# Patient Record
Sex: Female | Born: 1980 | Race: Black or African American | Hispanic: No | Marital: Single | Smoking: Current every day smoker
Health system: Southern US, Community
[De-identification: ages and names within clinical notes are randomized; demographics above are authoritative.]

## PROBLEM LIST (undated history)

## (undated) DIAGNOSIS — Z8619 Personal history of other infectious and parasitic diseases: Secondary | ICD-10-CM

## (undated) DIAGNOSIS — Z98891 History of uterine scar from previous surgery: Secondary | ICD-10-CM

## (undated) DIAGNOSIS — H02402 Unspecified ptosis of left eyelid: Principal | ICD-10-CM

## (undated) DIAGNOSIS — B9689 Other specified bacterial agents as the cause of diseases classified elsewhere: Secondary | ICD-10-CM

## (undated) DIAGNOSIS — N83209 Unspecified ovarian cyst, unspecified side: Secondary | ICD-10-CM

## (undated) DIAGNOSIS — Z975 Presence of (intrauterine) contraceptive device: Secondary | ICD-10-CM

## (undated) DIAGNOSIS — N760 Acute vaginitis: Secondary | ICD-10-CM

## (undated) DIAGNOSIS — K219 Gastro-esophageal reflux disease without esophagitis: Secondary | ICD-10-CM

## (undated) DIAGNOSIS — T7492XA Unspecified child maltreatment, confirmed, initial encounter: Secondary | ICD-10-CM

## (undated) HISTORY — DX: Acute vaginitis: N76.0

## (undated) HISTORY — DX: Unspecified ovarian cyst, unspecified side: N83.209

## (undated) HISTORY — DX: Personal history of other infectious and parasitic diseases: Z86.19

## (undated) HISTORY — DX: Other specified bacterial agents as the cause of diseases classified elsewhere: B96.89

## (undated) HISTORY — DX: Unspecified child maltreatment, confirmed, initial encounter: T74.92XA

## (undated) HISTORY — DX: Presence of (intrauterine) contraceptive device: Z97.5

## (undated) HISTORY — DX: Unspecified ptosis of left eyelid: H02.402

---

## 2000-11-03 ENCOUNTER — Emergency Department (HOSPITAL_COMMUNITY): Admission: EM | Admit: 2000-11-03 | Discharge: 2000-11-03 | Payer: Self-pay | Admitting: Emergency Medicine

## 2002-12-02 ENCOUNTER — Other Ambulatory Visit: Admission: RE | Admit: 2002-12-02 | Discharge: 2002-12-02 | Payer: Self-pay | Admitting: Gynecology

## 2003-10-09 ENCOUNTER — Emergency Department (HOSPITAL_COMMUNITY): Admission: EM | Admit: 2003-10-09 | Discharge: 2003-10-10 | Payer: Self-pay | Admitting: Emergency Medicine

## 2004-06-13 ENCOUNTER — Ambulatory Visit: Payer: Self-pay | Admitting: Internal Medicine

## 2006-01-17 ENCOUNTER — Emergency Department (HOSPITAL_COMMUNITY): Admission: EM | Admit: 2006-01-17 | Discharge: 2006-01-18 | Payer: Self-pay | Admitting: Emergency Medicine

## 2006-06-06 ENCOUNTER — Ambulatory Visit: Payer: Self-pay | Admitting: Family Medicine

## 2006-06-13 ENCOUNTER — Emergency Department (HOSPITAL_COMMUNITY): Admission: EM | Admit: 2006-06-13 | Discharge: 2006-06-13 | Payer: Self-pay | Admitting: Emergency Medicine

## 2006-06-13 ENCOUNTER — Ambulatory Visit (HOSPITAL_COMMUNITY): Admission: RE | Admit: 2006-06-13 | Discharge: 2006-06-13 | Payer: Self-pay | Admitting: Family Medicine

## 2006-06-27 ENCOUNTER — Encounter (INDEPENDENT_AMBULATORY_CARE_PROVIDER_SITE_OTHER): Payer: Self-pay | Admitting: *Deleted

## 2006-06-27 ENCOUNTER — Ambulatory Visit: Payer: Self-pay | Admitting: Family Medicine

## 2006-08-03 ENCOUNTER — Emergency Department (HOSPITAL_COMMUNITY): Admission: EM | Admit: 2006-08-03 | Discharge: 2006-08-03 | Payer: Self-pay | Admitting: Emergency Medicine

## 2006-10-29 ENCOUNTER — Ambulatory Visit: Payer: Self-pay | Admitting: Family Medicine

## 2006-10-29 ENCOUNTER — Ambulatory Visit: Payer: Self-pay | Admitting: *Deleted

## 2006-11-20 ENCOUNTER — Ambulatory Visit: Payer: Self-pay | Admitting: Family Medicine

## 2006-11-21 ENCOUNTER — Encounter (INDEPENDENT_AMBULATORY_CARE_PROVIDER_SITE_OTHER): Payer: Self-pay | Admitting: *Deleted

## 2006-12-18 ENCOUNTER — Emergency Department (HOSPITAL_COMMUNITY): Admission: EM | Admit: 2006-12-18 | Discharge: 2006-12-18 | Payer: Self-pay | Admitting: Emergency Medicine

## 2007-01-03 ENCOUNTER — Ambulatory Visit: Payer: Self-pay | Admitting: Family Medicine

## 2007-01-03 ENCOUNTER — Encounter (INDEPENDENT_AMBULATORY_CARE_PROVIDER_SITE_OTHER): Payer: Self-pay | Admitting: Family Medicine

## 2007-03-07 HISTORY — PX: WISDOM TOOTH EXTRACTION: SHX21

## 2007-08-12 ENCOUNTER — Encounter (INDEPENDENT_AMBULATORY_CARE_PROVIDER_SITE_OTHER): Payer: Self-pay | Admitting: Family Medicine

## 2007-08-12 ENCOUNTER — Ambulatory Visit: Payer: Self-pay | Admitting: Internal Medicine

## 2007-08-12 LAB — CONVERTED CEMR LAB
ALT: 12 units/L (ref 0–35)
AST: 12 units/L (ref 0–37)
Basophils Absolute: 0 10*3/uL (ref 0.0–0.1)
Chloride: 104 meq/L (ref 96–112)
Creatinine, Ser: 0.78 mg/dL (ref 0.40–1.20)
Eosinophils Absolute: 0.1 10*3/uL (ref 0.0–0.7)
Eosinophils Relative: 1 % (ref 0–5)
HCT: 40.3 % (ref 36.0–46.0)
Lymphocytes Relative: 35 % (ref 12–46)
Neutrophils Relative %: 57 % (ref 43–77)
Platelets: 220 10*3/uL (ref 150–400)
RDW: 12.7 % (ref 11.5–15.5)
Sed Rate: 4 mm/hr (ref 0–22)
Total Bilirubin: 0.5 mg/dL (ref 0.3–1.2)
Vit D, 1,25-Dihydroxy: 26 — ABNORMAL LOW (ref 30–89)

## 2007-09-30 ENCOUNTER — Ambulatory Visit: Payer: Self-pay | Admitting: Family Medicine

## 2007-09-30 ENCOUNTER — Inpatient Hospital Stay (HOSPITAL_COMMUNITY): Admission: AD | Admit: 2007-09-30 | Discharge: 2007-09-30 | Payer: Self-pay | Admitting: Family Medicine

## 2008-03-05 ENCOUNTER — Inpatient Hospital Stay (HOSPITAL_COMMUNITY): Admission: AD | Admit: 2008-03-05 | Discharge: 2008-03-05 | Payer: Self-pay | Admitting: Obstetrics and Gynecology

## 2008-05-02 ENCOUNTER — Inpatient Hospital Stay (HOSPITAL_COMMUNITY): Admission: AD | Admit: 2008-05-02 | Discharge: 2008-05-05 | Payer: Self-pay | Admitting: Obstetrics and Gynecology

## 2008-07-31 ENCOUNTER — Emergency Department (HOSPITAL_COMMUNITY): Admission: EM | Admit: 2008-07-31 | Discharge: 2008-07-31 | Payer: Self-pay | Admitting: Emergency Medicine

## 2010-02-28 ENCOUNTER — Inpatient Hospital Stay (HOSPITAL_COMMUNITY)
Admission: AD | Admit: 2010-02-28 | Discharge: 2010-02-28 | Payer: Self-pay | Source: Home / Self Care | Attending: Obstetrics & Gynecology | Admitting: Obstetrics & Gynecology

## 2010-05-16 LAB — POCT PREGNANCY, URINE: Preg Test, Ur: POSITIVE

## 2010-06-21 LAB — CBC
HCT: 42 % (ref 36.0–46.0)
Hemoglobin: 11.4 g/dL — ABNORMAL LOW (ref 12.0–15.0)
Hemoglobin: 13.9 g/dL (ref 12.0–15.0)
MCHC: 33.4 g/dL (ref 30.0–36.0)
MCV: 94.5 fL (ref 78.0–100.0)
RBC: 3.61 MIL/uL — ABNORMAL LOW (ref 3.87–5.11)
WBC: 15.4 10*3/uL — ABNORMAL HIGH (ref 4.0–10.5)

## 2010-07-19 NOTE — Op Note (Signed)
Holly Black, Holly Black NO.:  [id number]   MEDICAL RECORD NO.:  [id number]          PATIENT TYPE:  INP   LOCATION:  9143                          FACILITY:  [facility]   PHYSICIAN:  Janine Limbo, M.D.DATE OF BIRTH:  04/09/1980   DATE OF PROCEDURE:  05/02/2008  DATE OF DISCHARGE:                               OPERATIVE REPORT   PREOPERATIVE DIAGNOSES:  1. Term intrauterine gestation.  2. Failure to progress in labor.   POSTOPERATIVE DIAGNOSES:  1. Term intrauterine gestation.  2. Failure to progress in labor.  3. Right occiput transverse presentation.Marland Kitchen   PROCEDURE:  Primary low transverse cesarean section.   SURGEON:  Janine Limbo, MD   FIRST ASSISTANT:  Renaldo Reel. Emilee Hero, CNM.   ANESTHETIC:  Epidural.   DISPOSITION:  Holly Black is a 30 year old female, gravida 1, para 0, who  presented at 38 weeks and 6 days gestation.  She has been followed at  the [facility].  She ruptured her membranes this morning.  She  had very slow progression of her labor.  Pitocin augmentation was begun  and the patient did not progress in spite of greater than 240 [place]  units for several hours.  A cesarean section was recommended.  We  reviewed the risk associated with C-section including, but not limited  to, anesthetic complications, bleeding, infections, and possible damage  to the surrounding organs.  All of the patient's questions were  answered.  She was ready to proceed as described above.   FINDINGS:  A 6-pound 10-ounce female infant Holly Black) was delivered from a  right occiput transverse presentation.  The Apgars were 9 at 1 minute  and 9 at 5 minutes.  The uterus, fallopian tubes, and the ovaries were  normal for the gravid state.   PROCEDURE IN DETAIL:  The patient was taken to the operating room where  it was determined that the epidural she had received for labor would be  adequate  for cesarean delivery.  A Foley catheter had previously been  placed.  The patient's abdomen was prepped with multiple layers of  Betadine and then the patient was sterilely draped.  The patient had  received 2 g of Ancef 30 minutes prior to her procedure.  The lower  abdomen was injected with 10 mL of 0.5% Marcaine with epinephrine.  A  low transverse incision was made and carried sharply through the  subcutaneous tissue, the fascia, and the anterior peritoneum.  An  incision was made in the lower uterine segment and extended in a low-  transverse fashion.  The fetal head was delivered without difficulty.  The mouth and nose were suctioned.  The remainder of the infant was then  delivered.  The cord was clamped and cut, and the infant was handed to  the awaiting pediatric team.  The placenta was removed and given to the  Cord Blood Registry Team.  The placenta was sent to Labor and Delivery  after their completion.  The uterine cavity was cleaned of amniotic  fluid,  clotted blood, and membranes.  The uterine incision was closed  using a running locking suture of 2-0 Vicryl followed by an imbricating  suture of 2-0 Vicryl.  Hemostasis was adequate.  The bladder flap was  closed using a running suture of 2-0 Vicryl.  The pelvis was vigorously  irrigated.  Hemostasis was adequate.  The anterior peritoneum and the  abdominal musculature were reapproximated in the midline using 2-0  Vicryl.  The fascia was closed using a running suture of 0 Vicryl  followed by 3 interrupted sutures of 0 Vicryl.  The subcutaneous layer  was closed using a running suture of 0 Vicryl.  The skin was  reapproximated using a subcuticular suture of 3-0 Monocryl.  Sponge,  needle, and instrument counts were correct on 2 occasions.  The  estimated blood loss for the procedure was 700 mL.  The patient  tolerated her procedure well.  She was taken to the recovery room in  stable condition.  The infant was taken to the  Full-Term Nursery in  stable condition.       Janine Limbo, M.D.  Electronically Signed     AVS/MEDQ  D:  05/02/2008  T:  05/03/2008  Job:  [id number]
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
## 2010-07-19 NOTE — Discharge Summary (Signed)
NAMECOPELYN, WIDMER NO.:  [id number]   MEDICAL RECORD NO.:  [id number]          PATIENT TYPE:  INP   LOCATION:  9143                          [facility]   PHYSICIAN:  Janine Limbo, M.D.DATE OF BIRTH:  [birth year]   DATE OF ADMISSION:  05/02/2008  DATE OF DISCHARGE:  05/05/2008                               DISCHARGE SUMMARY   [name] is a 30 year old, gravida 1, now para 1-0-0-1, who came to the  hospital on May 02, 2008, at 38 weeks 6 days with rupture of  membranes.  She delivered a daughter later that day, Osborne Casco, who  weighed 6 pounds 10 ounces and had Apgars of 9 at 1 minute and 9 at 5  minutes.  Osborne Casco was delivered via cesarean section for failure to  progress.   ADMISSION DIAGNOSES:  1. Term intrauterine pregnancy at 38.6 weeks.  2. Spontaneous rupture of membranes.   DISCHARGE DIAGNOSES:  Primary low transverse cervical section for  failure to progress.   PERTINENT LABORATORY DATA:  [name] has a blood type was O positive.  She is rubella immune, and she had a negative GBS test at 36 weeks.  WBC  14.9 (15.4), HGB 11.4 (13.9), HCT 34.1 (42.0), PLT 139 (185).   PROCEDURES:  Pitocin augmentation, epidural primary LTCS.   PHYSICAL EXAMINATION AND VITAL SIGNS:  Today on the day of discharge,  [name]'s vital signs are 97.6, pulse 69, respirations 20, blood  pressure 124/74.  She is alert and oriented without any CNS complaints.  Her lungs were clear to auscultation bilaterally.  Heart has a regular  rate and rhythm without murmurs.  Her breasts are soft, and her nipples  were intact.  Her abdomen is soft without distention.  Her fundus is  firm and below the umbilicus.  She has very light lochia from her vagina  and her incision is intact with minor bruising above the incision.  She  is without edema of her hands and face and has trace edema of her  bilateral lower extremities with normal DTRs and negative Homans sign  x2.  She is  passing gas and moving well, and her pain is controlled with  the medications.   Her discharge medications include:  1. Prenatal vitamins.  2. Micronor to begin second Sunday after delivery.  3. Percocet.  4. Motrin.   The postpartum discharge booklet was provided and reviewed with the  patient and the father of the baby including danger signs of postpartum  and danger signs of the postoperative period.  [name]  understanding of all her discharge teaching, and she is deemed to have  received full benefit for the hospitalization of the birth of her  daughter, Osborne Casco.      Eulogio Bear, CNM      Janine Limbo, M.D.  Electronically Signed    JM/MEDQ  D:  05/05/2008  T:  05/06/2008  Job:  [id number]

## 2010-09-12 ENCOUNTER — Encounter (HOSPITAL_COMMUNITY): Payer: Self-pay

## 2010-09-12 ENCOUNTER — Encounter (HOSPITAL_COMMUNITY)
Admission: RE | Admit: 2010-09-12 | Discharge: 2010-09-12 | Disposition: A | Discharge status: Home / Self Care | Payer: Medicaid Other | Source: Ambulatory Visit | Attending: Obstetrics and Gynecology | Admitting: Obstetrics and Gynecology

## 2010-09-12 HISTORY — DX: Gastro-esophageal reflux disease without esophagitis: K21.9

## 2010-09-12 LAB — CBC
MCH: 30.7 pg (ref 26.0–34.0)
MCV: 89.3 fL (ref 78.0–100.0)
Platelets: 159 10*3/uL (ref 150–400)
RDW: 13 % (ref 11.5–15.5)

## 2010-09-12 NOTE — Patient Instructions (Signed)
Written instructions given

## 2010-09-15 LAB — MRSA CULTURE

## 2010-09-19 MED ORDER — CEFAZOLIN SODIUM-DEXTROSE 2-3 GM-% IV SOLR
2.0000 g | INTRAVENOUS | Status: AC
  Administered 2010-09-18: 2 g via INTRAVENOUS
  Filled 2010-09-19 (×2): qty 50

## 2010-09-20 ENCOUNTER — Encounter (HOSPITAL_COMMUNITY): Payer: Self-pay | Admitting: *Deleted

## 2010-09-20 ENCOUNTER — Encounter (HOSPITAL_COMMUNITY)
Admission: RE | Disposition: A | Discharge status: Home / Self Care | Payer: Self-pay | Source: Ambulatory Visit | Attending: Obstetrics and Gynecology

## 2010-09-20 ENCOUNTER — Encounter (HOSPITAL_COMMUNITY): Payer: Self-pay | Admitting: Anesthesiology

## 2010-09-20 ENCOUNTER — Inpatient Hospital Stay (HOSPITAL_COMMUNITY)
Admission: RE | Admit: 2010-09-20 | Discharge: 2010-09-22 | DRG: 766 | Disposition: A | Discharge status: Home / Self Care | Payer: Medicaid Other | Source: Ambulatory Visit | Attending: Obstetrics and Gynecology | Admitting: Obstetrics and Gynecology

## 2010-09-20 ENCOUNTER — Inpatient Hospital Stay (HOSPITAL_COMMUNITY): Payer: Medicaid Other | Admitting: Anesthesiology

## 2010-09-20 DIAGNOSIS — O34219 Maternal care for unspecified type scar from previous cesarean delivery: Principal | ICD-10-CM | POA: Diagnosis present

## 2010-09-20 DIAGNOSIS — Z98891 History of uterine scar from previous surgery: Secondary | ICD-10-CM

## 2010-09-20 DIAGNOSIS — Z331 Pregnant state, incidental: Secondary | ICD-10-CM

## 2010-09-20 DIAGNOSIS — Z01818 Encounter for other preprocedural examination: Secondary | ICD-10-CM

## 2010-09-20 DIAGNOSIS — Z01812 Encounter for preprocedural laboratory examination: Secondary | ICD-10-CM

## 2010-09-20 HISTORY — DX: History of uterine scar from previous surgery: Z98.891

## 2010-09-20 SURGERY — Surgical Case
Anesthesia: Spinal | Site: Abdomen | Wound class: Clean Contaminated

## 2010-09-20 MED ORDER — SIMETHICONE 80 MG PO CHEW: 80.0000 mg | CHEWABLE_TABLET | ORAL | Status: DC | PRN

## 2010-09-20 MED ORDER — DIPHENHYDRAMINE HCL 50 MG/ML IJ SOLN: 12.5000 mg | INTRAMUSCULAR | Status: DC | PRN

## 2010-09-20 MED ORDER — NALBUPHINE HCL 10 MG/ML IJ SOLN
5.0000 mg | INTRAMUSCULAR | Status: AC | PRN
  Filled 2010-09-20: qty 1

## 2010-09-20 MED ORDER — SIMETHICONE 80 MG PO CHEW
80.0000 mg | CHEWABLE_TABLET | Freq: Three times a day (TID) | ORAL | Status: DC
  Administered 2010-09-20 – 2010-09-21 (×6): 80 mg via ORAL

## 2010-09-20 MED ORDER — KETOROLAC TROMETHAMINE 60 MG/2ML IM SOLN
INTRAMUSCULAR | Status: AC
  Administered 2010-09-20: 60 mg via INTRAMUSCULAR
  Filled 2010-09-20: qty 2

## 2010-09-20 MED ORDER — DIPHENHYDRAMINE HCL 25 MG PO CAPS: 25.0000 mg | ORAL_CAPSULE | ORAL | Status: DC | PRN

## 2010-09-20 MED ORDER — BUPIVACAINE HCL 0.75 % IJ SOLN
INTRAMUSCULAR | Status: DC | PRN
  Administered 2010-09-20: 12.5 mg via INTRATHECAL

## 2010-09-20 MED ORDER — OXYCODONE-ACETAMINOPHEN 5-325 MG PO TABS
1.0000 | ORAL_TABLET | ORAL | Status: DC | PRN
  Administered 2010-09-21 (×3): 1 via ORAL
  Administered 2010-09-22 (×2): 2 via ORAL
  Filled 2010-09-20 (×4): qty 1
  Filled 2010-09-20: qty 2
  Filled 2010-09-20: qty 1

## 2010-09-20 MED ORDER — MEPERIDINE HCL 25 MG/ML IJ SOLN: 6.2500 mg | INTRAMUSCULAR | Status: DC | PRN

## 2010-09-20 MED ORDER — MORPHINE SULFATE 0.5 MG/ML IJ SOLN
INTRAMUSCULAR | Status: AC
  Filled 2010-09-20: qty 10

## 2010-09-20 MED ORDER — SODIUM CHLORIDE 0.9 % IR SOLN
Status: DC | PRN
  Administered 2010-09-20: 1000 mL

## 2010-09-20 MED ORDER — ONDANSETRON HCL 4 MG/2ML IJ SOLN: 4.0000 mg | INTRAMUSCULAR | Status: DC | PRN

## 2010-09-20 MED ORDER — EPHEDRINE SULFATE 50 MG/ML IJ SOLN
INTRAMUSCULAR | Status: DC | PRN
  Administered 2010-09-20 (×2): 5 mg via INTRAVENOUS

## 2010-09-20 MED ORDER — FENTANYL CITRATE 0.05 MG/ML IJ SOLN
INTRAMUSCULAR | Status: DC | PRN
  Administered 2010-09-20: 25 ug via INTRATHECAL

## 2010-09-20 MED ORDER — WITCH HAZEL-GLYCERIN EX PADS: MEDICATED_PAD | CUTANEOUS | Status: DC | PRN

## 2010-09-20 MED ORDER — FENTANYL CITRATE 0.05 MG/ML IJ SOLN
INTRAMUSCULAR | Status: AC
  Filled 2010-09-20: qty 2

## 2010-09-20 MED ORDER — ONDANSETRON HCL 4 MG PO TABS: 4.0000 mg | ORAL_TABLET | ORAL | Status: DC | PRN

## 2010-09-20 MED ORDER — OXYTOCIN 20 UNITS IN LACTATED RINGERS INFUSION - SIMPLE
INTRAVENOUS | Status: DC | PRN
  Administered 2010-09-20 (×2): 20 [IU] via INTRAVENOUS

## 2010-09-20 MED ORDER — MEDROXYPROGESTERONE ACETATE 150 MG/ML IM SUSP: 150.0000 mg | INTRAMUSCULAR | Status: DC | PRN

## 2010-09-20 MED ORDER — BUPIVACAINE-EPINEPHRINE 0.5% -1:200000 IJ SOLN
INTRAMUSCULAR | Status: DC | PRN
  Administered 2010-09-20: 10 mL

## 2010-09-20 MED ORDER — IBUPROFEN 600 MG PO TABS
600.0000 mg | ORAL_TABLET | Freq: Four times a day (QID) | ORAL | Status: DC | PRN
  Administered 2010-09-20: 600 mg via ORAL
  Filled 2010-09-20 (×3): qty 1

## 2010-09-20 MED ORDER — KETOROLAC TROMETHAMINE 60 MG/2ML IM SOLN
60.0000 mg | Freq: Once | INTRAMUSCULAR | Status: AC | PRN
  Administered 2010-09-20: 60 mg via INTRAMUSCULAR
  Filled 2010-09-20: qty 2

## 2010-09-20 MED ORDER — SODIUM CHLORIDE 0.9 % IJ SOLN: 3.0000 mL | INTRAMUSCULAR | Status: DC | PRN

## 2010-09-20 MED ORDER — OXYTOCIN 10 UNIT/ML IJ SOLN
INTRAMUSCULAR | Status: AC
  Filled 2010-09-20: qty 4

## 2010-09-20 MED ORDER — NALOXONE HCL 0.4 MG/ML IJ SOLN: 0.4000 mg | INTRAMUSCULAR | Status: DC | PRN

## 2010-09-20 MED ORDER — SODIUM CHLORIDE 0.9 % IV SOLN
1.0000 ug/kg/h | INTRAVENOUS | Status: DC | PRN
  Filled 2010-09-20: qty 2.5

## 2010-09-20 MED ORDER — KETOROLAC TROMETHAMINE 30 MG/ML IJ SOLN: 30.0000 mg | Freq: Four times a day (QID) | INTRAMUSCULAR | Status: AC | PRN

## 2010-09-20 MED ORDER — PRENATAL PLUS 27-1 MG PO TABS
1.0000 | ORAL_TABLET | Freq: Every day | ORAL | Status: DC
  Administered 2010-09-21: 1 via ORAL
  Filled 2010-09-20 (×2): qty 1

## 2010-09-20 MED ORDER — ONDANSETRON HCL 4 MG/2ML IJ SOLN: 4.0000 mg | Freq: Three times a day (TID) | INTRAMUSCULAR | Status: DC | PRN

## 2010-09-20 MED ORDER — MORPHINE SULFATE (PF) 0.5 MG/ML IJ SOLN
INTRAMUSCULAR | Status: DC | PRN
  Administered 2010-09-20: .15 mg via INTRATHECAL

## 2010-09-20 MED ORDER — IBUPROFEN 600 MG PO TABS
600.0000 mg | ORAL_TABLET | Freq: Four times a day (QID) | ORAL | Status: DC
  Administered 2010-09-21 – 2010-09-22 (×6): 600 mg via ORAL
  Filled 2010-09-20 (×4): qty 1

## 2010-09-20 MED ORDER — MEASLES, MUMPS & RUBELLA VAC ~~LOC~~ INJ
0.5000 mL | INJECTION | Freq: Once | SUBCUTANEOUS | Status: DC
  Filled 2010-09-20: qty 0.5

## 2010-09-20 MED ORDER — LACTATED RINGERS IV SOLN
INTRAVENOUS | Status: DC
  Administered 2010-09-20: 14:00:00 via INTRAVENOUS

## 2010-09-20 MED ORDER — METOCLOPRAMIDE HCL 5 MG/ML IJ SOLN: 10.0000 mg | Freq: Once | INTRAMUSCULAR | Status: DC | PRN

## 2010-09-20 MED ORDER — DIPHENHYDRAMINE HCL 50 MG/ML IJ SOLN: 25.0000 mg | INTRAMUSCULAR | Status: DC | PRN

## 2010-09-20 MED ORDER — PHENYLEPHRINE HCL 10 MG/ML IJ SOLN
INTRAMUSCULAR | Status: DC | PRN
  Administered 2010-09-20 (×2): 40 ug via INTRAVENOUS

## 2010-09-20 MED ORDER — MENTHOL 3 MG MT LOZG: 1.0000 | LOZENGE | OROMUCOSAL | Status: DC | PRN

## 2010-09-20 MED ORDER — SCOPOLAMINE 1 MG/3DAYS TD PT72
1.0000 | MEDICATED_PATCH | TRANSDERMAL | Status: DC
  Administered 2010-09-20: 1.5 mg via TRANSDERMAL

## 2010-09-20 MED ORDER — DIPHENHYDRAMINE HCL 25 MG PO CAPS: 25.0000 mg | ORAL_CAPSULE | Freq: Four times a day (QID) | ORAL | Status: DC | PRN

## 2010-09-20 MED ORDER — ZOLPIDEM TARTRATE 5 MG PO TABS: 5.0000 mg | ORAL_TABLET | Freq: Every evening | ORAL | Status: DC | PRN

## 2010-09-20 MED ORDER — SCOPOLAMINE 1 MG/3DAYS TD PT72
MEDICATED_PATCH | TRANSDERMAL | Status: AC
  Administered 2010-09-20: 1.5 mg via TRANSDERMAL
  Filled 2010-09-20: qty 1

## 2010-09-20 MED ORDER — LACTATED RINGERS IV SOLN
INTRAVENOUS | Status: DC
  Administered 2010-09-18 – 2010-09-20 (×5): via INTRAVENOUS

## 2010-09-20 MED ORDER — PHENYLEPHRINE 40 MCG/ML (10ML) SYRINGE FOR IV PUSH (FOR BLOOD PRESSURE SUPPORT)
PREFILLED_SYRINGE | INTRAVENOUS | Status: AC
  Filled 2010-09-20: qty 15

## 2010-09-20 MED ORDER — SENNOSIDES-DOCUSATE SODIUM 8.6-50 MG PO TABS
1.0000 | ORAL_TABLET | Freq: Every day | ORAL | Status: DC
  Administered 2010-09-20: 1 via ORAL
  Administered 2010-09-21: 2 via ORAL

## 2010-09-20 MED ORDER — ONDANSETRON HCL 4 MG/2ML IJ SOLN
INTRAMUSCULAR | Status: DC | PRN
  Administered 2010-09-20: 4 mg via INTRAVENOUS

## 2010-09-20 MED ORDER — ONDANSETRON HCL 4 MG/2ML IJ SOLN
INTRAMUSCULAR | Status: AC
  Filled 2010-09-20: qty 2

## 2010-09-20 MED ORDER — OXYTOCIN 20 UNITS IN LACTATED RINGERS INFUSION - SIMPLE: 125.0000 mL/h | INTRAVENOUS | Status: DC

## 2010-09-20 MED ORDER — TETANUS-DIPHTH-ACELL PERTUSSIS 5-2.5-18.5 LF-MCG/0.5 IM SUSP
0.5000 mL | Freq: Once | INTRAMUSCULAR | Status: AC
  Administered 2010-09-21: 0.5 mL via INTRAMUSCULAR
  Filled 2010-09-20: qty 0.5

## 2010-09-20 MED ORDER — FENTANYL CITRATE 0.05 MG/ML IJ SOLN: 25.0000 ug | INTRAMUSCULAR | Status: DC | PRN

## 2010-09-20 SURGICAL SUPPLY — 39 items
CHLORAPREP W/TINT 26ML (MISCELLANEOUS) ×2 IMPLANT
CLOTH BEACON ORANGE TIMEOUT ST (SAFETY) ×2 IMPLANT
CONTAINER PREFILL 10% NBF 15ML (MISCELLANEOUS) IMPLANT
DRAIN JACKSON PRT FLT 7MM (DRAIN) IMPLANT
DRAPE UTILITY XL STRL (DRAPES) ×2 IMPLANT
DRESSING TELFA 8X3 (GAUZE/BANDAGES/DRESSINGS) ×2 IMPLANT
ELECT REM PT RETURN 9FT ADLT (ELECTROSURGICAL) ×2
ELECTRODE REM PT RTRN 9FT ADLT (ELECTROSURGICAL) ×1 IMPLANT
EVACUATOR SILICONE 100CC (DRAIN) IMPLANT
EXTRACTOR VACUUM M CUP 4 TUBE (SUCTIONS) IMPLANT
GAUZE SPONGE 4X4 12PLY STRL LF (GAUZE/BANDAGES/DRESSINGS) ×3 IMPLANT
GLOVE BIOGEL PI IND STRL 8.5 (GLOVE) ×1 IMPLANT
GLOVE BIOGEL PI INDICATOR 8.5 (GLOVE) ×1
GLOVE ECLIPSE 8.0 STRL XLNG CF (GLOVE) ×4 IMPLANT
GOWN BRE IMP SLV AUR LG STRL (GOWN DISPOSABLE) ×4 IMPLANT
GOWN STRL REIN 2XL LVL4 (GOWN DISPOSABLE) ×2 IMPLANT
KIT ABG SYR 3ML LUER SLIP (SYRINGE) IMPLANT
NDL HYPO 25X1 1.5 SAFETY (NEEDLE) ×1 IMPLANT
NDL HYPO 25X5/8 SAFETYGLIDE (NEEDLE) IMPLANT
NEEDLE HYPO 25X1 1.5 SAFETY (NEEDLE) ×2 IMPLANT
NEEDLE HYPO 25X5/8 SAFETYGLIDE (NEEDLE) IMPLANT
PACK C SECTION WH (CUSTOM PROCEDURE TRAY) ×2 IMPLANT
PAD ABD 7.5X8 STRL (GAUZE/BANDAGES/DRESSINGS) ×2 IMPLANT
SLEEVE SCD COMPRESS KNEE MED (MISCELLANEOUS) IMPLANT
STAPLER VISISTAT 35W (STAPLE) IMPLANT
SUT MNCRL AB 3-0 PS2 27 (SUTURE) IMPLANT
SUT PLAIN 0 NONE (SUTURE) IMPLANT
SUT SILK 3 0 FS 1X18 (SUTURE) IMPLANT
SUT VIC AB 0 CT1 27 (SUTURE) ×4
SUT VIC AB 0 CT1 27XBRD ANBCTR (SUTURE) ×2 IMPLANT
SUT VIC AB 2-0 CTX 36 (SUTURE) ×4 IMPLANT
SUT VIC AB 3-0 CT1 27 (SUTURE)
SUT VIC AB 3-0 CT1 TAPERPNT 27 (SUTURE) IMPLANT
SUT VIC AB 3-0 SH 27 (SUTURE)
SUT VIC AB 3-0 SH 27X BRD (SUTURE) IMPLANT
SYR CONTROL 10ML LL (SYRINGE) ×2 IMPLANT
TOWEL OR 17X24 6PK STRL BLUE (TOWEL DISPOSABLE) ×4 IMPLANT
TRAY FOLEY CATH 14FR (SET/KITS/TRAYS/PACK) ×2 IMPLANT
WATER STERILE IRR 1000ML POUR (IV SOLUTION) ×2 IMPLANT

## 2010-09-20 NOTE — Anesthesia Postprocedure Evaluation (Signed)
  Anesthesia Post-op Note  Patient: Holly Black  Procedure(s) Performed:  CESAREAN SECTION - Repeat Cesarean Section; baby boy   @0959       ;apgars9/9  Patient Location: PACU  Anesthesia Type: Spinal  Level of Consciousness: awake, alert  and oriented  Airway and Oxygen Therapy: Patient Spontanous Breathing  Post-op Pain: none  Post-op Assessment: Post-op Vital signs reviewed, Patient's Cardiovascular Status Stable, Respiratory Function Stable, Patent Airway, No signs of Nausea or vomiting, Pain level controlled, No headache, No backache, No residual numbness and No residual motor weakness  Post-op Vital Signs: Reviewed and stable  Complications: No apparent anesthesia complications

## 2010-09-20 NOTE — Anesthesia Procedure Notes (Addendum)
Spinal Block  Patient location during procedure: OR Start time: 09/20/2010 9:45 AM Staffing Anesthesiologist: FOSTER, MICHAEL A. Performed by: anesthesiologist  Spinal Block Patient position: sitting Prep: site prepped and draped and DuraPrep Patient monitoring: continuous pulse ox and blood pressure Approach: midline Location: L3-4 Injection technique: single-shot Needle Needle type: Sprotte  Needle gauge: 24 G Needle length: 9 cm Needle insertion depth: 4 cm Assessment Sensory level: T4 Additional Notes Pt. Tolerated procedure well.

## 2010-09-20 NOTE — Transfer of Care (Deleted)
Immediate Anesthesia Transfer of Care Note  Patient: Holly Black  Procedure(s) Performed:  CESAREAN SECTION - Repeat Cesarean Section; baby boy   @0959      ;apgars9/9  Patient Location: PACU  Anesthesia Type: Spinal  Level of Consciousness: awake, alert  and oriented  Airway & Oxygen Therapy: Patient Spontanous Breathing  Post-op Assessment: Report given to PACU RN  Post vital signs: Reviewed and stable  Complications: No apparent anesthesia complications 
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
## 2010-09-20 NOTE — Anesthesia Preprocedure Evaluation (Addendum)
Anesthesia Evaluation  General Assessment Comment  Airway Mallampati: I      Dental No notable dental hx    Pulmonary    pulmonary exam normal   Cardiovascular    Neuro/Psych  GI/Hepatic/Renal   Endo/Other   Abdominal Normal abdominal exam  (+)   Musculoskeletal  Hematology   Peds  Reproductive/Obstetrics   Anesthesia Other Findings             Anesthesia Physical Anesthesia Plan  ASA: II  Anesthesia Plan: Spinal   Post-op Pain Management:    Induction:   Airway Management Planned:   Additional Equipment:   Intra-op Plan:   Post-operative Plan:   Informed Consent: I have reviewed the patients History and Physical, chart, labs and discussed the procedure including the risks, benefits and alternatives for the proposed anesthesia with the patient or authorized representative who has indicated his/her understanding and acceptance.     Plan Discussed with: CRNA  Anesthesia Plan Comments:        Anesthesia Quick Evaluation

## 2010-09-20 NOTE — Progress Notes (Signed)
Mom reports baby latching well to right breast, right nipple more erect, attempted latch to left breast, lots of edema at aerola, nipple flattens with sandwiching, baby unable to obtain good latch. Enc mom the pre-pump with hand pump, reverse pressure massage demonstrated, given shells to wear when able to get a bra on. Lactation brochure given to mom and reviewed. Community resources for breastfeeding mom discussed. Advised of outpatient services. Enc to offer breast every 2-3 hours or on demand. Call for assistance as needed with latch and positioning.

## 2010-09-20 NOTE — Op Note (Signed)
OPERATIVE NOTE  [name]  (Medical records #[id number])  09/20/2010  Preoperative diagnosis:  #1 term pregnancy #2 prior cesarean section #3 desires repeat cesarean section  Postoperative diagnosis:  #1 term pregnancy #2 prior cesarean section #3 desires repeat cesarean section  Procedure:  Repeat low transverse cesarean section  Surgeon:  Jonanthan Bolender Vernon Labarron Durnin, M.D.  Asst.:  Vicki Latham, certified nurse midwife  Anesthesia:  Spinal  Disposition:  [name] is a 29-year-old female, gravida 2 para 1-0-0-1, who presents at [redacted] weeks gestation. The patient has been followed at the [facility]. This pregnancy has been complicated by a prior cesarean section. The patient desires a repeat cesarean section. She understands the indications for her procedure and she accepts the risk of, but not limited to, anesthetic complications, bleeding, infections, and possible damage to the surrounding organs.  Findings:  A 7 lbs. 2 oz. female infant (Kumar) was delivered from a cephalic presentation. The Apgar scores were 9 at 1 minute and 9 at 5 minutes. The uterus, fallopian tubes, and ovaries were normal for the gravid state.  Procedure:  The patient was taken to the operating room where a spinal anesthetic was given. The patient's abdomen was prepped with Chloraprep. The perineum was prepped with betadine. A Foley catheter was placed in the bladder. The patient was sterilely draped. The lower abdomen was injected with half percent Marcaine with epinephrine. A low transverse incision was made in the abdomen and carried sharply through the subcutaneous tissue, the fascia, and the anterior peritoneum. An incision was made in the lower uterine segment. The incision was extended in a low transverse fashion. The membranes were ruptured. The fetal head was delivered without difficulty. The mouth and nose were suctioned.  One nuchal cord was reduced. The remainder of the infant was then delivered. The cord was clamped and cut. The infant was handed to the awaiting pediatric team. The placenta was removed. The uterine cavity was cleaned of amniotic fluid, clotted blood, and membranes. The uterine incision was closed using a running locking suture of 2-0 Vicryl. An imbricating suture of 2-0 Vicryl was placed. The pelvis was vigorously irrigated. Hemostasis was adequate. The anterior peritoneum and the abdominal musculature were closed using 2-0 Vicryl. The fascia was closed using a running suture of 0 Vicryl followed by 3 interrupted sutures of 0 Vicryl. The subcutaneous layer was closed using interrupted sutures of 2-0 Vicryl. The skin was reapproximated using a subcuticular suture of 3-0 Monocryl. Sponge, needle, and instrument counts were correct on 2 occasions. The estimated blood loss for the procedure was 650 cc. The patient tolerated her procedure well. She was transported to the recovery room in stable condition. The infant was taken to the full-term nursery in stable condition. The placenta was sent to labor and delivery.   

## 2010-09-20 NOTE — Transfer of Care (Signed)
Immediate Anesthesia Transfer of Care Note  Patient: Holly Black  Procedure(s) Performed:  CESAREAN SECTION - Repeat Cesarean Section; baby boy   @0959       ;apgars9/9  Patient Location: PACU  Anesthesia Type: Spinal  Level of Consciousness: awake, alert  and oriented  Airway & Oxygen Therapy: Patient Spontanous Breathing  Post-op Assessment: Report given to PACU RN  Post vital signs: Reviewed and stable  Complications: No apparent anesthesia complications

## 2010-09-20 NOTE — Consult Note (Signed)
Called to attend C/S at term gestation and no risk factors.  AROM (clear) at delivery.  Infant in vertex at delivery with loose nuchal cord x 1, manually reduced. Spontaneous cries with manual extraction and movement of all extremities.  Given tactile stim and bulb suction during transition which was uneventful.  Apgar 9/9 at on e and five minutes.Shown to parents then carried to Transitional Nursery by father. Care to Dr. Diamantina Monks

## 2010-09-20 NOTE — Anesthesia Postprocedure Evaluation (Signed)
  Anesthesia Post-op Note  Patient: Holly Black  Procedure(s) Performed:  CESAREAN SECTION - Repeat Cesarean Section; baby boy   @0959      ;apgars9/9  Patient Location: PACU  Anesthesia Type: Spinal  Level of Consciousness: awake, alert  and oriented  Airway and Oxygen Therapy: Patient Spontanous Breathing  Post-op Pain: none  Post-op Assessment: Post-op Vital signs reviewed, Patient's Cardiovascular Status Stable, Respiratory Function Stable, Patent Airway, No signs of Nausea or vomiting, Pain level controlled, No headache, No backache, No residual numbness and No residual motor weakness  Post-op Vital Signs: Reviewed and stable  Complications: No apparent anesthesia complications 

## 2010-09-20 NOTE — Op Note (Signed)
OPERATIVE NOTE  Holly Black  (Medical records #[id number])  09/20/2010  Preoperative diagnosis:  #1 term pregnancy #2 prior cesarean section #3 desires repeat cesarean section  Postoperative diagnosis:  #1 term pregnancy #2 prior cesarean section #3 desires repeat cesarean section  Procedure:  Repeat low transverse cesarean section  Surgeon:  Leonard Schwartz, M.D.  Asst.:  Nigel Bridgeman, certified nurse midwife  Anesthesia:  Spinal  Disposition:  Holly Black is a 30 year old female, gravida 2 para 1-0-0-1, who presents at [redacted] weeks gestation. The patient has been followed at the [facility] obstetrics and gynecology division of [facility] health care for women. This pregnancy has been complicated by a prior cesarean section. The patient desires a repeat cesarean section. She understands the indications for her procedure and she accepts the risk of, but not limited to, anesthetic complications, bleeding, infections, and possible damage to the surrounding organs.  Findings:  A 7 lbs. 2 oz. female infant Holly Black) was delivered from a cephalic presentation. The Apgar scores were 9 at 1 minute and 9 at 5 minutes. The uterus, fallopian tubes, and ovaries were normal for the gravid state.  Procedure:  The patient was taken to the operating room where a spinal anesthetic was given. The patient's abdomen was prepped with Chloraprep. The perineum was prepped with betadine. A Foley catheter was placed in the bladder. The patient was sterilely draped. The lower abdomen was injected with half percent Marcaine with epinephrine. A low transverse incision was made in the abdomen and carried sharply through the subcutaneous tissue, the fascia, and the anterior peritoneum. An incision was made in the lower uterine segment. The incision was extended in a low transverse fashion. The membranes were ruptured. The fetal head was delivered without difficulty. The mouth and nose were suctioned.  One nuchal cord was reduced. The remainder of the infant was then delivered. The cord was clamped and cut. The infant was handed to the awaiting pediatric team. The placenta was removed. The uterine cavity was cleaned of amniotic fluid, clotted blood, and membranes. The uterine incision was closed using a running locking suture of 2-0 Vicryl. An imbricating suture of 2-0 Vicryl was placed. The pelvis was vigorously irrigated. Hemostasis was adequate. The anterior peritoneum and the abdominal musculature were closed using 2-0 Vicryl. The fascia was closed using a running suture of 0 Vicryl followed by 3 interrupted sutures of 0 Vicryl. The subcutaneous layer was closed using interrupted sutures of 2-0 Vicryl. The skin was reapproximated using a subcuticular suture of 3-0 Monocryl. Sponge, needle, and instrument counts were correct on 2 occasions. The estimated blood loss for the procedure was 650 cc. The patient tolerated her procedure well. She was transported to the recovery room in stable condition. The infant was taken to the full-term nursery in stable condition. The placenta was sent to labor and delivery.

## 2010-09-20 NOTE — Addendum Note (Signed)
Addendum  created 09/20/10 1159 by Tyrone Apple. Shenee Wignall   Modules edited:Notes Section

## 2010-09-21 ENCOUNTER — Encounter (HOSPITAL_COMMUNITY): Payer: Self-pay | Admitting: Unknown Physician Specialty

## 2010-09-21 LAB — CBC
MCH: 30.4 pg (ref 26.0–34.0)
MCHC: 33.5 g/dL (ref 30.0–36.0)
MCV: 90.6 fL (ref 78.0–100.0)
Platelets: 156 10*3/uL (ref 150–400)
RBC: 3.85 MIL/uL — ABNORMAL LOW (ref 3.87–5.11)
RDW: 13.1 % (ref 11.5–15.5)

## 2010-09-21 LAB — ABO/RH

## 2010-09-21 NOTE — Progress Notes (Signed)
UR chart review completed.  

## 2010-09-21 NOTE — Progress Notes (Signed)
LC ASSISTED WITH FEEDING.  AREOLA FULL AND NIPPLES FLAT THEREFORE GOOD FIRM SANDWICHING TECHNIQUE NEEDED FOR DEEP LATCH.  BABY DID LATCH WELL AFTER A FEW ATTEMPTS WITH GOOD SUCK/SWALLOW OBSERVED.  ENCOURAGED TO CALL OUT WITH CONCERNS OR ASSIST PRN.

## 2010-09-21 NOTE — Progress Notes (Signed)
Subjective: Postpartum Day 1: Cesarean Delivery Patient reports tolerating PO, + flatus and no problems voiding.   Ambulating without problems Breastfeeding established, Lactation consultation completed Adequate pain control with Percocet prn Objective: Vital signs in last 24 hours: Temp:  [97.3 F (36.3 C)-98.1 F (36.7 C)] 97.5 F (36.4 C) (07/18 0550) Pulse Rate:  [54-71] 63  (07/18 0550) Resp:  [14-20] 20  (07/18 0550) BP: (105-145)/(57-81) 118/74 mmHg (07/18 0550) SpO2:  [95 %-99 %] 96 % (07/18 0550) Weight:  [75.751 kg (167 lb)] 167 lb (75.751 kg) (07/17 1403)  Physical Exam:  General: alert, cooperative and no distress Lochia: scant rubra Uterine Fundus: Firm @U -1 Midline Incision: Dsg C,D&I DVT Evaluation: No evidence of DVT seen on physical exam. Negative Homan's sign. No cords or calf tenderness. Has utilized SCD's through nt when in bed   [facility] 09/21/10 0531  HGB 11.7*  HCT 34.9*    Assessment/Plan: Status post Cesarean section POD 1. Doing well postoperatively.  Continue current care. Continue to ambulate Remove bandage in shower today Pt desires Outpt Circ Plans for Mirena for PP Contraceptive method Consider early d/c in am if Breastfeeding well   Holly Black 09/21/2010, 7:38 AM

## 2010-09-22 MED ORDER — OXYCODONE-ACETAMINOPHEN 5-325 MG PO TABS: 1.0000 | ORAL_TABLET | ORAL | Status: AC | PRN

## 2010-09-22 MED ORDER — IBUPROFEN 600 MG PO TABS: 600.0000 mg | ORAL_TABLET | Freq: Four times a day (QID) | ORAL | Status: AC

## 2010-09-22 NOTE — Progress Notes (Signed)
Subjective: Postpartum Day 2: Cesarean Delivery  Patient reports no problems voiding.  Had BM yesterday.  Up ad lib.  Desires d/c today.  Peds OK for baby d/c.  Plans Mirena and outpatient circumcision.    Objective: Vital signs in last 24 hours: Temp:  [98 F (36.7 C)-98.8 F (37.1 C)] 98.3 F (36.8 C) (07/19 0518) Pulse Rate:  [58-76] 66  (07/19 0518) Resp:  [18-20] 18  (07/19 0518) BP: (121-133)/(70-87) 125/70 mmHg (07/19 0518)  Physical Exam:  General: alert Lochia: appropriate Uterine Fundus: firm Incision: healing well DVT Evaluation: No evidence of DVT seen on physical exam.   Basename 09/21/10 0531  HGB 11.7*  HCT 34.9*    Assessment/Plan: Status post Cesarean section. Doing well postoperatively.  Discharge home with standard precautions and return to clinic in 4-6 weeks. Rx Ibuprophen and Percocet. Schedule outpatient circ.  Peder Allums L 09/22/2010, 8:45 AM

## 2010-09-22 NOTE — Progress Notes (Signed)
Discussed weight loss ,infant has voided and stooled consistently . Sbili=.9 , per mom sore nipples lt =rt for soreness .  Breast fill fuller . aerolos bouncey /swollen  ,encouraged breast shells and hand rxpress or pre pump hand pump . Reviewed engorgement tx. If needed .

## 2010-10-04 NOTE — Discharge Summary (Signed)
Obstetric Discharge Summary Reason for Admission: cesarean section Prenatal Procedures: none Intrapartum Procedures: cesarean: low cervical, transverse Postpartum Procedures: none Complications-Operative and Postpartum: none Hemoglobin  Date Value Range Status  09/21/2010 11.7* 12.0-15.0 (g/dL) Final     HCT  Date Value Range Status  09/21/2010 34.9* 36.0-46.0 (%) Final    Discharge Diagnoses: Term pregnancy deliviered                                         Prior CS,desu  Discharge Information: Date:09/22/10 Activity: pelvic rest Diet: routine Medications: See medication reconciliation Condition: stable Instructions: refer to practice specific booklet Discharge to: home   Newborn Data: Live born female  Birth Weight: 7 lb 2.8 oz (3255 g) APGAR: 9, 9  Home with mother.  Giavonni Fonder P 10/04/2010, 9:55 AM

## 2010-10-06 ENCOUNTER — Encounter (HOSPITAL_COMMUNITY): Payer: Self-pay | Admitting: Obstetrics and Gynecology

## 2010-12-02 LAB — URINALYSIS, ROUTINE W REFLEX MICROSCOPIC
Bilirubin Urine: NEGATIVE
Glucose, UA: NEGATIVE
Hgb urine dipstick: NEGATIVE
Ketones, ur: NEGATIVE
Protein, ur: NEGATIVE
pH: 6.5

## 2011-08-28 ENCOUNTER — Encounter: Payer: Self-pay | Admitting: Obstetrics and Gynecology

## 2011-09-01 ENCOUNTER — Encounter: Payer: Self-pay | Admitting: Obstetrics and Gynecology

## 2011-09-01 DIAGNOSIS — B9689 Other specified bacterial agents as the cause of diseases classified elsewhere: Secondary | ICD-10-CM

## 2011-09-01 DIAGNOSIS — N760 Acute vaginitis: Secondary | ICD-10-CM | POA: Insufficient documentation

## 2011-09-04 ENCOUNTER — Encounter: Payer: Self-pay | Admitting: Obstetrics and Gynecology

## 2011-09-11 ENCOUNTER — Encounter: Payer: Self-pay | Admitting: Obstetrics and Gynecology

## 2012-01-06 ENCOUNTER — Emergency Department (HOSPITAL_COMMUNITY)
Admission: EM | Admit: 2012-01-06 | Discharge: 2012-01-06 | Disposition: A | Discharge status: Home / Self Care | Payer: Medicaid Other | Attending: Emergency Medicine | Admitting: Emergency Medicine

## 2012-01-06 ENCOUNTER — Encounter (HOSPITAL_COMMUNITY): Payer: Self-pay | Admitting: Emergency Medicine

## 2012-01-06 DIAGNOSIS — S56919A Strain of unspecified muscles, fascia and tendons at forearm level, unspecified arm, initial encounter: Secondary | ICD-10-CM

## 2012-01-06 DIAGNOSIS — K219 Gastro-esophageal reflux disease without esophagitis: Secondary | ICD-10-CM | POA: Insufficient documentation

## 2012-01-06 DIAGNOSIS — Y929 Unspecified place or not applicable: Secondary | ICD-10-CM | POA: Insufficient documentation

## 2012-01-06 DIAGNOSIS — Z87891 Personal history of nicotine dependence: Secondary | ICD-10-CM | POA: Insufficient documentation

## 2012-01-06 DIAGNOSIS — Z8742 Personal history of other diseases of the female genital tract: Secondary | ICD-10-CM | POA: Insufficient documentation

## 2012-01-06 DIAGNOSIS — X503XXA Overexertion from repetitive movements, initial encounter: Secondary | ICD-10-CM | POA: Insufficient documentation

## 2012-01-06 DIAGNOSIS — Y9389 Activity, other specified: Secondary | ICD-10-CM | POA: Insufficient documentation

## 2012-01-06 DIAGNOSIS — IMO0002 Reserved for concepts with insufficient information to code with codable children: Secondary | ICD-10-CM | POA: Insufficient documentation

## 2012-01-06 DIAGNOSIS — Z8619 Personal history of other infectious and parasitic diseases: Secondary | ICD-10-CM | POA: Insufficient documentation

## 2012-01-06 NOTE — ED Notes (Signed)
Pt reports a 3 day hx of r/forearm pain and tenderness, denies trauma

## 2012-01-06 NOTE — ED Notes (Signed)
Pt states she developed a "knot" in her wrist about 3 days ago.  States that it is tender to touch and sensitive with movement.  Knot barely visible over right forearm.
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
## 2012-01-06 NOTE — ED Provider Notes (Signed)
Medical screening examination/treatment/procedure(s) were performed by non-physician practitioner and as supervising physician I was immediately available for consultation/collaboration.  Doug Sou, MD 01/06/12 1606

## 2012-05-01 ENCOUNTER — Encounter: Payer: Self-pay | Admitting: Obstetrics and Gynecology

## 2012-05-01 ENCOUNTER — Ambulatory Visit: Payer: Medicaid Other | Admitting: Obstetrics and Gynecology

## 2012-05-01 VITALS — BP 114/70 | Temp 98.8°F | Ht 62.5 in | Wt 156.0 lb

## 2012-05-01 DIAGNOSIS — Z124 Encounter for screening for malignant neoplasm of cervix: Secondary | ICD-10-CM

## 2012-05-01 DIAGNOSIS — Z309 Encounter for contraceptive management, unspecified: Secondary | ICD-10-CM

## 2012-05-01 DIAGNOSIS — Z01419 Encounter for gynecological examination (general) (routine) without abnormal findings: Secondary | ICD-10-CM

## 2012-05-01 LAB — POCT URINE PREGNANCY: Preg Test, Ur: NEGATIVE

## 2012-05-01 NOTE — Progress Notes (Signed)
Regular Periods: no IUD Mammogram: no  Monthly Breast Ex.: yes Exercise: yes  Tetanus < 10 years: no Seatbelts: yes  NI. Bladder Functn.: yes Abuse at home: no  Daily BM's: yes Stressful Work: no  Healthy Diet: yes Sigmoid-Colonoscopy: NO  Calcium: no Medical problems this year: CHECK IUD   LAST PAP:2012  Contraception: IUD MIRENA  Mammogram:  NO  PCP: NO  PMH: no change  FMH: no change  Last Bone Scan: no  Pt is in a relationship

## 2012-05-01 NOTE — Progress Notes (Signed)
Subjective:    Holly Black is a 32 y.o. female, G2P1002, who presents for an annual exam. The patient reports  Menstrual cycle:   LMP: No LMP recorded. Patient is not currently having periods (Reason: IUD).             Review of Systems Pertinent items are noted in HPI. Denies pelvic pain, urinary tract symptoms, vaginitis symptoms, irregular bleeding, menopausal symptoms, change in bowel habits or rectal bleeding   Objective:    BP 114/70  Temp(Src) 98.8 F (37.1 C) (Oral)  Ht 5' 2.5" (1.588 m)  Wt 156 lb (70.761 kg)  BMI 28.06 kg/m2    Wt Readings from Last 1 Encounters:  05/01/12 156 lb (70.761 kg)   Body mass index is 28.06 kg/(m^2). General Appearance: Alert, no acute distress HEENT: Grossly normal Neck / Thyroid: Supple, no thyromegaly or cervical adenopathy Lungs: Clear to auscultation bilaterally Back: No CVA tenderness Breast Exam: No masses or nodes.No dimpling, nipple retraction or discharge. Cardiovascular: Regular rate and rhythm.  Gastrointestinal: Soft, non-tender, no masses or organomegaly Pelvic Exam: EGBUS-wnl, vagina-normal rugae, cervix- string visible, without lesions or tenderness, uterus appears normal size shape and consistency, adnexae-no masses or tenderness Rectovaginal: no masses and normal sphincter tone Lymphatic Exam: Non-palpable nodes in neck, clavicular,  axillary, or inguinal regions  Skin: no rashes or abnormalities Extremities: no clubbing cyanosis or edema  Neurologic: grossly normal Psychiatric: Alert and oriented   UPT-negative Assessment:   Routine GYN Exam Mirena IUD   Plan:  STD testing  PAP sent  RTO 1 year or prn  Zayveon Raschke,ELMIRAPA-C

## 2012-05-03 LAB — PAP IG, CT-NG, RFX HPV ASCU: Chlamydia Probe Amp: NEGATIVE

## 2012-06-05 ENCOUNTER — Encounter: Payer: Self-pay | Admitting: Neurology

## 2012-06-05 ENCOUNTER — Ambulatory Visit (INDEPENDENT_AMBULATORY_CARE_PROVIDER_SITE_OTHER): Payer: Medicaid Other | Admitting: Neurology

## 2012-06-05 VITALS — BP 105/67 | HR 69 | Temp 98.7°F | Ht 64.0 in | Wt 157.0 lb

## 2012-06-05 DIAGNOSIS — H02402 Unspecified ptosis of left eyelid: Secondary | ICD-10-CM

## 2012-06-05 DIAGNOSIS — H02409 Unspecified ptosis of unspecified eyelid: Secondary | ICD-10-CM

## 2012-06-05 HISTORY — DX: Unspecified ptosis of left eyelid: H02.402
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
## 2012-06-05 NOTE — Patient Instructions (Addendum)
I think overall you are doing fairly well and are stable at this point. You do have a droopy eyelid on the left but her exam otherwise is normal. I do want to do a few things today, including the brain and orbital MRI, and some blood work. Please call us back for test results so we can keep you informed. I will see you back in 3 months, sooner if we need to. There is no new medications from my end of things. I do have some generic suggestions for you today:  Please make sure that you drink plenty of fluids. I would like for you to exercise daily for example in the form of walking 20-30 minutes every day, if you can. Please keep a regular sleep-wake schedule, keep regular meal times, do not skip any meals, eat  healthy snacks in between meals, such as fruit or nuts. Try to eat protein with every meal.   I do not think we need to make any changes in your medications at this point. Please call us if you have any interim questions, concerns, or problems or updates to need to discuss.  Please also call us for any test results so we can go over those with you on the phone. Brett Canales is my clinical assistant and will answer any of your questions and relay your messages to me and will give you my messages.   Our phone number is [phone]. We also have an after hours call service for urgent matters and there is a physician on-call for urgent questions. For any emergencies you know to call 911 or go to the nearest emergency room.

## 2012-06-20 ENCOUNTER — Other Ambulatory Visit: Payer: Medicaid Other

## 2012-06-20 ENCOUNTER — Ambulatory Visit
Admission: RE | Admit: 2012-06-20 | Discharge: 2012-06-20 | Disposition: A | Discharge status: Home / Self Care | Payer: Medicaid Other | Source: Ambulatory Visit | Attending: Neurology | Admitting: Neurology

## 2012-06-20 DIAGNOSIS — H02402 Unspecified ptosis of left eyelid: Secondary | ICD-10-CM

## 2012-06-20 MED ORDER — GADOBENATE DIMEGLUMINE 529 MG/ML IV SOLN
15.0000 mL | Freq: Once | INTRAVENOUS | Status: AC | PRN
  Administered 2012-06-20: 15 mL via INTRAVENOUS

## 2012-06-21 NOTE — Progress Notes (Signed)
Quick Note:  Spoke with patient and relayed results of MRI. Patient understood and had no questions. ______ 

## 2012-06-21 NOTE — Progress Notes (Signed)
Quick Note:  Please call and advise the patient that the recent scan we did was within normal limits. We checked an MRI of her orbits. Please remind patient to keep any upcoming appointments and call with any interim questions, concerns, problems or updates. Thanks,  Huston Foley, MD, PhD  ______

## 2013-04-09 ENCOUNTER — Encounter (HOSPITAL_COMMUNITY): Payer: Self-pay | Admitting: Emergency Medicine

## 2013-04-09 ENCOUNTER — Emergency Department (HOSPITAL_COMMUNITY)
Admission: EM | Admit: 2013-04-09 | Discharge: 2013-04-10 | Discharge status: Against Medical Advice | Payer: Medicaid Other | Attending: Emergency Medicine | Admitting: Emergency Medicine

## 2013-04-09 DIAGNOSIS — Z792 Long term (current) use of antibiotics: Secondary | ICD-10-CM | POA: Insufficient documentation

## 2013-04-09 DIAGNOSIS — R110 Nausea: Secondary | ICD-10-CM | POA: Insufficient documentation

## 2013-04-09 DIAGNOSIS — K219 Gastro-esophageal reflux disease without esophagitis: Secondary | ICD-10-CM | POA: Insufficient documentation

## 2013-04-09 DIAGNOSIS — Z975 Presence of (intrauterine) contraceptive device: Secondary | ICD-10-CM | POA: Insufficient documentation

## 2013-04-09 DIAGNOSIS — Z79899 Other long term (current) drug therapy: Secondary | ICD-10-CM | POA: Insufficient documentation

## 2013-04-09 DIAGNOSIS — R1013 Epigastric pain: Secondary | ICD-10-CM | POA: Insufficient documentation

## 2013-04-09 DIAGNOSIS — R109 Unspecified abdominal pain: Secondary | ICD-10-CM

## 2013-04-09 DIAGNOSIS — Z8619 Personal history of other infectious and parasitic diseases: Secondary | ICD-10-CM | POA: Insufficient documentation

## 2013-04-09 DIAGNOSIS — F17200 Nicotine dependence, unspecified, uncomplicated: Secondary | ICD-10-CM | POA: Insufficient documentation

## 2013-04-09 DIAGNOSIS — Z87440 Personal history of urinary (tract) infections: Secondary | ICD-10-CM | POA: Insufficient documentation

## 2013-04-09 DIAGNOSIS — Z8669 Personal history of other diseases of the nervous system and sense organs: Secondary | ICD-10-CM | POA: Insufficient documentation

## 2013-04-09 DIAGNOSIS — Z8742 Personal history of other diseases of the female genital tract: Secondary | ICD-10-CM | POA: Insufficient documentation

## 2013-04-09 NOTE — ED Notes (Signed)
Pt states she has been having abd pain for the past couple of weeks  Pt states she has been having diarrhea  No vomiting  Last BM was today, this morning

## 2013-04-10 ENCOUNTER — Emergency Department (HOSPITAL_COMMUNITY): Payer: Medicaid Other

## 2013-04-10 MED ORDER — ONDANSETRON HCL 4 MG/2ML IJ SOLN: 4.0000 mg | Freq: Once | INTRAMUSCULAR | Status: DC

## 2013-04-10 MED ORDER — FAMOTIDINE IN NACL 20-0.9 MG/50ML-% IV SOLN
20.0000 mg | Freq: Once | INTRAVENOUS | Status: DC
  Filled 2013-04-10: qty 50

## 2013-04-10 NOTE — ED Notes (Signed)
Upon entering pt. Room to do lab draw pt. Had left the room, unable to draw labs. RN, Irving BurtonEmily notified.

## 2013-04-10 NOTE — ED Notes (Signed)
Nursing staff have attempted to find patient without success Not in bathroom or in hallways Hospital gown on bed, patient belongings not in room Will inform MD
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
## 2013-04-13 ENCOUNTER — Encounter (HOSPITAL_COMMUNITY): Payer: Self-pay | Admitting: Emergency Medicine

## 2013-04-13 ENCOUNTER — Emergency Department (HOSPITAL_COMMUNITY)
Admission: EM | Admit: 2013-04-13 | Discharge: 2013-04-14 | Disposition: A | Discharge status: Other Acute Inpatient Hospital | Payer: Medicaid Other | Attending: Emergency Medicine | Admitting: Emergency Medicine

## 2013-04-13 DIAGNOSIS — F17200 Nicotine dependence, unspecified, uncomplicated: Secondary | ICD-10-CM | POA: Insufficient documentation

## 2013-04-13 DIAGNOSIS — F29 Unspecified psychosis not due to a substance or known physiological condition: Secondary | ICD-10-CM | POA: Insufficient documentation

## 2013-04-13 DIAGNOSIS — Z8669 Personal history of other diseases of the nervous system and sense organs: Secondary | ICD-10-CM | POA: Insufficient documentation

## 2013-04-13 DIAGNOSIS — Z8742 Personal history of other diseases of the female genital tract: Secondary | ICD-10-CM | POA: Insufficient documentation

## 2013-04-13 DIAGNOSIS — E876 Hypokalemia: Secondary | ICD-10-CM | POA: Insufficient documentation

## 2013-04-13 DIAGNOSIS — Z8719 Personal history of other diseases of the digestive system: Secondary | ICD-10-CM | POA: Insufficient documentation

## 2013-04-13 DIAGNOSIS — F333 Major depressive disorder, recurrent, severe with psychotic symptoms: Secondary | ICD-10-CM | POA: Insufficient documentation

## 2013-04-13 DIAGNOSIS — Z3202 Encounter for pregnancy test, result negative: Secondary | ICD-10-CM | POA: Insufficient documentation

## 2013-04-13 DIAGNOSIS — Z8619 Personal history of other infectious and parasitic diseases: Secondary | ICD-10-CM | POA: Insufficient documentation

## 2013-04-13 LAB — RAPID URINE DRUG SCREEN, HOSP PERFORMED
AMPHETAMINES: NOT DETECTED
BARBITURATES: NOT DETECTED
Benzodiazepines: NOT DETECTED
Cocaine: NOT DETECTED
OPIATES: NOT DETECTED
Tetrahydrocannabinol: POSITIVE — AB

## 2013-04-13 LAB — COMPREHENSIVE METABOLIC PANEL
ALT: 22 U/L (ref 0–35)
AST: 31 U/L (ref 0–37)
Albumin: 4.4 g/dL (ref 3.5–5.2)
Alkaline Phosphatase: 72 U/L (ref 39–117)
BUN: <3 mg/dL — ABNORMAL LOW (ref 6–23)
CALCIUM: 9.6 mg/dL (ref 8.4–10.5)
CHLORIDE: 93 meq/L — AB (ref 96–112)
CO2: 28 mEq/L (ref 19–32)
Creatinine, Ser: 0.71 mg/dL (ref 0.50–1.10)
GLUCOSE: 121 mg/dL — AB (ref 70–99)
Potassium: 2.8 mEq/L — CL (ref 3.7–5.3)
Sodium: 138 mEq/L (ref 137–147)
Total Bilirubin: 1.1 mg/dL (ref 0.3–1.2)
Total Protein: 8.4 g/dL — ABNORMAL HIGH (ref 6.0–8.3)

## 2013-04-13 LAB — URINALYSIS, ROUTINE W REFLEX MICROSCOPIC
Bilirubin Urine: NEGATIVE
Glucose, UA: NEGATIVE mg/dL
Ketones, ur: NEGATIVE mg/dL
Nitrite: NEGATIVE
Protein, ur: NEGATIVE mg/dL
SPECIFIC GRAVITY, URINE: 1.004 — AB (ref 1.005–1.030)
UROBILINOGEN UA: 0.2 mg/dL (ref 0.0–1.0)
pH: 6.5 (ref 5.0–8.0)

## 2013-04-13 LAB — SALICYLATE LEVEL: Salicylate Lvl: <2 mg/dL — ABNORMAL LOW (ref 2.8–20.0)

## 2013-04-13 LAB — ETHANOL: Alcohol, Ethyl (B): <11 mg/dL (ref 0–11)

## 2013-04-13 LAB — URINE MICROSCOPIC-ADD ON

## 2013-04-13 LAB — ACETAMINOPHEN LEVEL: Acetaminophen (Tylenol), Serum: <15 ug/mL (ref 10–30)

## 2013-04-13 LAB — POCT PREGNANCY, URINE: PREG TEST UR: NEGATIVE

## 2013-04-13 MED ORDER — ALUM & MAG HYDROXIDE-SIMETH 200-200-20 MG/5ML PO SUSP: 30.0000 mL | ORAL | Status: DC | PRN

## 2013-04-13 MED ORDER — IBUPROFEN 400 MG PO TABS: 600.0000 mg | ORAL_TABLET | Freq: Three times a day (TID) | ORAL | Status: DC | PRN

## 2013-04-13 MED ORDER — LORAZEPAM 1 MG PO TABS: 1.0000 mg | ORAL_TABLET | Freq: Three times a day (TID) | ORAL | Status: DC | PRN

## 2013-04-13 MED ORDER — ACETAMINOPHEN 325 MG PO TABS: 650.0000 mg | ORAL_TABLET | ORAL | Status: DC | PRN

## 2013-04-13 MED ORDER — STERILE WATER FOR INJECTION IJ SOLN
INTRAMUSCULAR | Status: AC
  Administered 2013-04-13: 10 mL
  Filled 2013-04-13: qty 10

## 2013-04-13 MED ORDER — ZOLPIDEM TARTRATE 5 MG PO TABS: 5.0000 mg | ORAL_TABLET | Freq: Every evening | ORAL | Status: DC | PRN

## 2013-04-13 MED ORDER — ONDANSETRON HCL 4 MG PO TABS: 4.0000 mg | ORAL_TABLET | Freq: Three times a day (TID) | ORAL | Status: DC | PRN

## 2013-04-13 MED ORDER — ZIPRASIDONE MESYLATE 20 MG IM SOLR
10.0000 mg | INTRAMUSCULAR | Status: DC | PRN
  Administered 2013-04-13: 10 mg via INTRAMUSCULAR
  Filled 2013-04-13: qty 20

## 2013-04-13 MED ORDER — POTASSIUM CHLORIDE CRYS ER 20 MEQ PO TBCR
40.0000 meq | EXTENDED_RELEASE_TABLET | Freq: Once | ORAL | Status: AC
  Administered 2013-04-13: 40 meq via ORAL
  Filled 2013-04-13: qty 2

## 2013-04-13 MED ORDER — ZIPRASIDONE MESYLATE 20 MG IM SOLR
10.0000 mg | INTRAMUSCULAR | Status: DC | PRN
  Administered 2013-04-14: 10 mg via INTRAMUSCULAR
  Filled 2013-04-13: qty 20

## 2013-04-13 NOTE — ED Notes (Signed)
Patient currently changing into scrubs at this time.

## 2013-04-13 NOTE — ED Notes (Signed)
Patient out to nurses station. States in aggressive tone that "you are going to give me my stuff back and i am going home. i have children to attend to"  Asked pt to please return to her room. Continues to demand her things. Called for security assistance and pt quieted and walked to her room

## 2013-04-13 NOTE — ED Notes (Signed)
PT HAS REQUESTED THAT HER HUSBAND AND MOTHER NOT BE ALLOWED TO VISIT HER. CALLED NURSE FIRST AND MADE KAREN AWARE.

## 2013-04-13 NOTE — ED Notes (Signed)
PT HUSBAND AT BEDSIDE. HE STATES THAT HE DOESN'T THINK PT NEEDS TO GO TO A PSYCHIATRIC FACILITY. HE STATES "SHE IS JUST GOING THRU SOMETHING" AND REQUESTS SHE HAVE OUTPATIENT CARE. STATES PT MOTHER TOOK OUT THE IVC PAPERS "SO SHE COULD CALM DOWN AND GET SOME REST". ATTEMPTED TO EXPLAIN TO PT AND SPOUSE WHY SHE WAS IVC. NEITHER SEEM TO UNDERSTAND. HUSBAND REQUESTS THAT HE GET TO TALK TO PSYCH. WIFE IS AGREEABLE. CALLED BH AND SPOKE TO EMILY AND SHE WAS GIVEN MESSAGE AND PT HUSBAND PHONE NUMBER. [phone]) Koleen NimrodADRIAN

## 2013-04-13 NOTE — BH Assessment (Signed)
Received a call for tele-assessment. Spoke with the doctor who stated that patient was committed by her husband for concerns about her delusions. It has been reported that pt has been hearing voices that have been threatening to harm her and her children. Pt thinks that someone is practicing voodoo on her. She is very paranoid and will not remain in her room. She has a very odd affect. She denies being married. Tele-assessment will be initiated.

## 2013-04-13 NOTE — ED Notes (Signed)
[facility], [employer] and security notified of patient in department.

## 2013-04-13 NOTE — Progress Notes (Signed)
1135 Pt has been declined at [facility].   Tomi BambergerMariya Toneisha Savary Disposition MHT

## 2013-04-13 NOTE — ED Notes (Signed)
Patient completed tele-psych at this time.

## 2013-04-13 NOTE — ED Notes (Signed)
Security and [employer] called to assist in getting pt back to her room after refusing to return when asked; pt insists she has been here for 24 hours and that she wants a Clinical research associatelawyer. Explained to pt that the doctor felt she needed psychiatric care and she has been committed and cannot leave. Pt continues to want to leave. Pt back to her room without incident. Sitter with patient

## 2013-04-13 NOTE — ED Notes (Signed)
Patient in scrubs and wanded by security.

## 2013-04-13 NOTE — ED Notes (Signed)
[facility]

## 2013-04-13 NOTE — ED Notes (Signed)
Patient now resting in bed with eyes closed. Airway intact. Respirations even and unlabored. No acute distress noted at this time. Sitter remains at bedside.
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
## 2013-04-13 NOTE — Progress Notes (Signed)
The following facilities have been contacted regarding inptx:   [facility]- per Mariam can fax, referral faxed  [facility]- per Danny low acuity beds only, no psychosis [facility]- per Cassie at capacity  [facility], [facility]  [facility]- per Dorathy DaftKayla 1 female and 1 female bed available low acuity no psychosis [facility]- per Zollie Scaleolivia at capacity  [facility]- per Maralyn SagoSarah at capacity, wait list available only  [facility]- referral faxed  [facility] Samara DeistKathryn [facility]  [facility]  [facility]- per [place] bed availability, referral faxed  [facility]- no answer  [facility] Lupita Leashonna at [facility]    Holly Black Disposition MHT

## 2013-04-13 NOTE — ED Notes (Signed)
PT'S MOTHER, BARBARA, [phone], CALLED TO CHECK ON PT. ADVISED HER OF HIPAA POLICY AND POD C POLICY FOR VISITING HOURS.

## 2013-04-13 NOTE — ED Notes (Signed)
[facility]
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
## 2013-04-13 NOTE — ED Notes (Signed)
Phlebotomy at bedside.
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
## 2013-04-13 NOTE — ED Notes (Signed)
Patient in hall stating that she will not go back in her room and that she is leaving. Sitter, this RN and [employer] asked patient to return to room. Pt finally returned to room and is standing in the doorway.

## 2013-04-13 NOTE — ED Notes (Signed)
Patient attempting to leave room. This RN and sitter at bedside redirected patient into room. Dr. Norlene Campbelltter now at bedside explaining to the patient that she cannot leave. Patient becoming irritated and aggressive towards sitter and this RN. Patient still attempting to get out the door and leave. Verbal order at this time given by Dr. Norlene Campbelltter to administer 10mg  IM of Geodon.

## 2013-04-13 NOTE — ED Notes (Signed)
Pt to receive diet tray. 

## 2013-04-13 NOTE — ED Notes (Signed)
PT'S FATHER RALPH, [phone], CALLED TO ASK IF PT IS HERE. ADVISED YES AND UNABLE TO PROVIDE FURTHER INFORMATION D/T HIPAA. VOICED UNDERSTANDING.

## 2013-04-13 NOTE — ED Notes (Signed)
Pt given a cup of ice. Per sitter pt threw away the soda given to her earlier because it wasn't in a can. Pt also will take a sip out of her ice water and throw the cup away. Encouraged pt to try to take more than 1 sip out of a cup.

## 2013-04-13 NOTE — ED Notes (Signed)
Patient not cooperating with putting scrubs on at this time. [employer] at bedside.

## 2013-04-13 NOTE — ED Notes (Signed)
Pt noted to be in blue paper scrubs and wearing glasses.
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
## 2013-04-14 ENCOUNTER — Encounter (HOSPITAL_COMMUNITY): Payer: Self-pay | Admitting: Emergency Medicine

## 2013-04-14 ENCOUNTER — Inpatient Hospital Stay: Payer: Self-pay | Admitting: Psychiatry

## 2013-04-14 LAB — URINE CULTURE: Colony Count: 30000

## 2013-04-14 NOTE — ED Notes (Signed)
Patient left the room with sitter, notfiy security of patient leaving pod c

## 2013-04-14 NOTE — BH Assessment (Signed)
Received call from Patsy, RN at [facility]. Dr Jennet MaduroPucilowska has accepted Pt to their facility. Pt is under IVC and sheriff's department will need to be contacted at [phone] for transportation. Patsy asked that nursing report be called to [phone] when sheriff's deputy arrives to transport Pt. Notified Dr. Dierdre Highmanpitz and Lavone NianPaul Smith, RN of acceptance.  Harlin RainFord Ellis Ria CommentWarrick Jr, LPC, [facility] Triage Specialist

## 2013-04-14 NOTE — ED Notes (Signed)
Patient attempted to leave department at this time.  Patient walked from Pod C21 down hallway to Pod B.  Patient was stopped by Tim, RT and myself and brought back to ED.  Patient tearful and agitated at this time.  Primary RN aware.

## 2013-04-14 NOTE — ED Notes (Signed)
Report received from Paul, RN

## 2013-04-14 NOTE — ED Notes (Signed)
PATIENT REFUSED TO TAKE A BATH, AT THAT TIME DID NOT APPEAR AGITATED AND START RUNNING DOWN THE HALL TOWARD TO POD B, [place] [employer] INTERCEPTED PATIENT AND ESCORTED PATIENT BACK TO HER ROOM.

## 2013-04-14 NOTE — ED Notes (Signed)
Spoke with sheriff's department regarding transfer-- states should be here " within an hour or so."

## 2013-04-14 NOTE — ED Notes (Signed)
Patient seen by sitter and nurses leaving POD C.  Sitter followed patient running thru department, patient left department thru ambulance bay doors, up stairs and ran toward [facility] building.  Patient stopped and sat on bench behind [facility] building before train tracks.  Patient was retrieved by Security and Karolee StampsJanelle, Charity fundraiserN and Piedad Climesarling, registration.  Patient brought back to Pod C and given Geodon injections per Dr Dierdre Highmanpitz verbal order.

## 2013-04-14 NOTE — ED Notes (Signed)
LEFT MESSAGE FOR THE SHERIFF TRANSPORT TO PICK PATIENT UP [phone]

## 2013-04-18 LAB — PREGNANCY, URINE: Pregnancy Test, Urine: NEGATIVE m[IU]/mL

## 2013-05-07 ENCOUNTER — Emergency Department (HOSPITAL_COMMUNITY)
Admission: EM | Admit: 2013-05-07 | Discharge: 2013-05-07 | Disposition: A | Discharge status: Home / Self Care | Payer: Medicaid Other | Attending: Emergency Medicine | Admitting: Emergency Medicine

## 2013-05-07 ENCOUNTER — Encounter (HOSPITAL_COMMUNITY): Payer: Self-pay | Admitting: Emergency Medicine

## 2013-05-07 DIAGNOSIS — Z975 Presence of (intrauterine) contraceptive device: Secondary | ICD-10-CM | POA: Insufficient documentation

## 2013-05-07 DIAGNOSIS — Z8742 Personal history of other diseases of the female genital tract: Secondary | ICD-10-CM | POA: Insufficient documentation

## 2013-05-07 DIAGNOSIS — Z79899 Other long term (current) drug therapy: Secondary | ICD-10-CM | POA: Insufficient documentation

## 2013-05-07 DIAGNOSIS — Z8669 Personal history of other diseases of the nervous system and sense organs: Secondary | ICD-10-CM | POA: Insufficient documentation

## 2013-05-07 DIAGNOSIS — F17200 Nicotine dependence, unspecified, uncomplicated: Secondary | ICD-10-CM | POA: Insufficient documentation

## 2013-05-07 DIAGNOSIS — K5641 Fecal impaction: Secondary | ICD-10-CM | POA: Insufficient documentation

## 2013-05-07 DIAGNOSIS — Z8619 Personal history of other infectious and parasitic diseases: Secondary | ICD-10-CM | POA: Insufficient documentation

## 2013-05-07 MED ORDER — FLEET ENEMA 7-19 GM/118ML RE ENEM
1.0000 | ENEMA | Freq: Once | RECTAL | Status: AC
  Administered 2013-05-07: 1 via RECTAL
  Filled 2013-05-07: qty 1
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
## 2013-05-07 NOTE — ED Notes (Signed)
Pt c/o possible boil or hemorrhoid at rectum; states blood with wiping; pain

## 2013-05-07 NOTE — ED Notes (Addendum)
Assisted patient to the bathroom. When EDP went into the room, patient was not there and the patient's gown was laying on the bedside table. Patient and visitors gone. Bathrooms checked. Patient not seen.

## 2014-01-05 ENCOUNTER — Encounter (HOSPITAL_COMMUNITY): Payer: Self-pay | Admitting: Emergency Medicine

## 2014-03-30 ENCOUNTER — Encounter (HOSPITAL_COMMUNITY): Payer: Self-pay | Admitting: Emergency Medicine

## 2014-03-30 ENCOUNTER — Emergency Department (HOSPITAL_COMMUNITY)
Admission: EM | Admit: 2014-03-30 | Discharge: 2014-03-30 | Disposition: A | Discharge status: Home / Self Care | Payer: Medicaid Other | Attending: Emergency Medicine | Admitting: Emergency Medicine

## 2014-03-30 DIAGNOSIS — Z8669 Personal history of other diseases of the nervous system and sense organs: Secondary | ICD-10-CM | POA: Diagnosis not present

## 2014-03-30 DIAGNOSIS — Z3202 Encounter for pregnancy test, result negative: Secondary | ICD-10-CM | POA: Diagnosis not present

## 2014-03-30 DIAGNOSIS — Z8719 Personal history of other diseases of the digestive system: Secondary | ICD-10-CM | POA: Diagnosis not present

## 2014-03-30 DIAGNOSIS — N898 Other specified noninflammatory disorders of vagina: Secondary | ICD-10-CM | POA: Diagnosis present

## 2014-03-30 DIAGNOSIS — Z792 Long term (current) use of antibiotics: Secondary | ICD-10-CM | POA: Insufficient documentation

## 2014-03-30 DIAGNOSIS — Z8619 Personal history of other infectious and parasitic diseases: Secondary | ICD-10-CM | POA: Diagnosis not present

## 2014-03-30 DIAGNOSIS — Z79899 Other long term (current) drug therapy: Secondary | ICD-10-CM | POA: Insufficient documentation

## 2014-03-30 DIAGNOSIS — Z720 Tobacco use: Secondary | ICD-10-CM | POA: Diagnosis not present

## 2014-03-30 LAB — URINALYSIS, ROUTINE W REFLEX MICROSCOPIC
Bilirubin Urine: NEGATIVE
Glucose, UA: NEGATIVE mg/dL
Hgb urine dipstick: NEGATIVE
KETONES UR: NEGATIVE mg/dL
LEUKOCYTES UA: NEGATIVE
Nitrite: NEGATIVE
PROTEIN: NEGATIVE mg/dL
Specific Gravity, Urine: 1.006 (ref 1.005–1.030)
Urobilinogen, UA: 1 mg/dL (ref 0.0–1.0)
pH: 8 (ref 5.0–8.0)

## 2014-03-30 LAB — WET PREP, GENITAL
Trich, Wet Prep: NONE SEEN
YEAST WET PREP: NONE SEEN

## 2014-03-30 LAB — POC URINE PREG, ED: Preg Test, Ur: NEGATIVE

## 2014-03-30 NOTE — Discharge Instructions (Signed)

## 2014-03-30 NOTE — ED Notes (Signed)
Pt c/o vaginal discharge that is thick "tan color" for about 1 week. Pt wants STD check bc she hasnt had it done in about 2 years.
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
## 2014-03-30 NOTE — ED Notes (Signed)
Patient was seen walking out the front door, I attempted to stop patient but she kept going out the door.

## 2014-03-31 LAB — HIV ANTIBODY (ROUTINE TESTING W REFLEX): HIV 1/HIV 2 AB: NONREACTIVE

## 2014-03-31 LAB — RPR: RPR Ser Ql: NONREACTIVE

## 2014-03-31 LAB — GC/CHLAMYDIA PROBE AMP (~~LOC~~) NOT AT ARMC
CHLAMYDIA, DNA PROBE: NEGATIVE
Neisseria Gonorrhea: NEGATIVE
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
## 2015-10-19 ENCOUNTER — Emergency Department (HOSPITAL_COMMUNITY): Payer: Medicaid Other

## 2015-10-19 ENCOUNTER — Encounter (HOSPITAL_COMMUNITY): Payer: Self-pay

## 2015-10-19 ENCOUNTER — Inpatient Hospital Stay (HOSPITAL_COMMUNITY)
Admission: EM | Admit: 2015-10-19 | Discharge: 2015-10-21 | DRG: 641 | Discharge status: Against Medical Advice | Payer: Medicaid Other | Attending: Internal Medicine | Admitting: Internal Medicine

## 2015-10-19 DIAGNOSIS — Z975 Presence of (intrauterine) contraceptive device: Secondary | ICD-10-CM

## 2015-10-19 DIAGNOSIS — F1210 Cannabis abuse, uncomplicated: Secondary | ICD-10-CM | POA: Diagnosis present

## 2015-10-19 DIAGNOSIS — K219 Gastro-esophageal reflux disease without esophagitis: Secondary | ICD-10-CM | POA: Diagnosis present

## 2015-10-19 DIAGNOSIS — Z9119 Patient's noncompliance with other medical treatment and regimen: Secondary | ICD-10-CM

## 2015-10-19 DIAGNOSIS — E873 Alkalosis: Secondary | ICD-10-CM | POA: Diagnosis present

## 2015-10-19 DIAGNOSIS — N390 Urinary tract infection, site not specified: Secondary | ICD-10-CM | POA: Diagnosis present

## 2015-10-19 DIAGNOSIS — I493 Ventricular premature depolarization: Secondary | ICD-10-CM | POA: Diagnosis present

## 2015-10-19 DIAGNOSIS — Z823 Family history of stroke: Secondary | ICD-10-CM

## 2015-10-19 DIAGNOSIS — E876 Hypokalemia: Principal | ICD-10-CM | POA: Diagnosis present

## 2015-10-19 DIAGNOSIS — F17290 Nicotine dependence, other tobacco product, uncomplicated: Secondary | ICD-10-CM | POA: Diagnosis present

## 2015-10-19 DIAGNOSIS — Z803 Family history of malignant neoplasm of breast: Secondary | ICD-10-CM

## 2015-10-19 DIAGNOSIS — Z8249 Family history of ischemic heart disease and other diseases of the circulatory system: Secondary | ICD-10-CM

## 2015-10-19 DIAGNOSIS — Z833 Family history of diabetes mellitus: Secondary | ICD-10-CM

## 2015-10-19 DIAGNOSIS — E878 Other disorders of electrolyte and fluid balance, not elsewhere classified: Secondary | ICD-10-CM | POA: Diagnosis present

## 2015-10-19 DIAGNOSIS — R9431 Abnormal electrocardiogram [ECG] [EKG]: Secondary | ICD-10-CM

## 2015-10-19 LAB — CBC WITH DIFFERENTIAL/PLATELET
BASOS ABS: 0 10*3/uL (ref 0.0–0.1)
Basophils Relative: 0 %
EOS PCT: 0 %
Eosinophils Absolute: 0.1 10*3/uL (ref 0.0–0.7)
HCT: 49.3 % — ABNORMAL HIGH (ref 36.0–46.0)
HEMOGLOBIN: 18.2 g/dL — AB (ref 12.0–15.0)
LYMPHS PCT: 23 %
Lymphs Abs: 3 10*3/uL (ref 0.7–4.0)
MCH: 31.4 pg (ref 26.0–34.0)
MCHC: 36.9 g/dL — ABNORMAL HIGH (ref 30.0–36.0)
MCV: 85 fL (ref 78.0–100.0)
Monocytes Absolute: 0.9 10*3/uL (ref 0.1–1.0)
Monocytes Relative: 7 %
NEUTROS PCT: 69 %
Neutro Abs: 8.9 10*3/uL — ABNORMAL HIGH (ref 1.7–7.7)
PLATELETS: 177 10*3/uL (ref 150–400)
RBC: 5.8 MIL/uL — AB (ref 3.87–5.11)
RDW: 12.8 % (ref 11.5–15.5)
WBC: 12.9 10*3/uL — AB (ref 4.0–10.5)

## 2015-10-19 LAB — URINE MICROSCOPIC-ADD ON

## 2015-10-19 LAB — PHOSPHORUS: PHOSPHORUS: 3.6 mg/dL (ref 2.5–4.6)

## 2015-10-19 LAB — COMPREHENSIVE METABOLIC PANEL
ALK PHOS: 67 U/L (ref 38–126)
ALT: 62 U/L — AB (ref 14–54)
AST: 140 U/L — AB (ref 15–41)
Albumin: 4.2 g/dL (ref 3.5–5.0)
Anion gap: 13 (ref 5–15)
BUN: 6 mg/dL (ref 6–20)
CHLORIDE: 76 mmol/L — AB (ref 101–111)
CO2: 45 mmol/L — AB (ref 22–32)
CREATININE: 0.68 mg/dL (ref 0.44–1.00)
Calcium: 9.1 mg/dL (ref 8.9–10.3)
GFR calc Af Amer: >60 mL/min (ref >60)
GFR calc non Af Amer: >60 mL/min (ref >60)
Glucose, Bld: 110 mg/dL — ABNORMAL HIGH (ref 65–99)
Potassium: <2 mmol/L — CL (ref 3.5–5.1)
SODIUM: 134 mmol/L — AB (ref 135–145)
Total Bilirubin: 1.5 mg/dL — ABNORMAL HIGH (ref 0.3–1.2)
Total Protein: 7.7 g/dL (ref 6.5–8.1)

## 2015-10-19 LAB — RAPID URINE DRUG SCREEN, HOSP PERFORMED
AMPHETAMINES: NOT DETECTED
BARBITURATES: NOT DETECTED
Benzodiazepines: NOT DETECTED
Cocaine: NOT DETECTED
Opiates: NOT DETECTED
TETRAHYDROCANNABINOL: POSITIVE — AB

## 2015-10-19 LAB — URINALYSIS, ROUTINE W REFLEX MICROSCOPIC
BILIRUBIN URINE: NEGATIVE
Glucose, UA: NEGATIVE mg/dL
KETONES UR: NEGATIVE mg/dL
NITRITE: NEGATIVE
PROTEIN: NEGATIVE mg/dL
Specific Gravity, Urine: 1.007 (ref 1.005–1.030)
pH: 6.5 (ref 5.0–8.0)

## 2015-10-19 LAB — TROPONIN I: Troponin I: <0.03 ng/mL (ref <0.03)

## 2015-10-19 LAB — MAGNESIUM: MAGNESIUM: 2.1 mg/dL (ref 1.7–2.4)

## 2015-10-19 MED ORDER — POTASSIUM CHLORIDE CRYS ER 20 MEQ PO TBCR
40.0000 meq | EXTENDED_RELEASE_TABLET | Freq: Once | ORAL | Status: DC
  Filled 2015-10-19: qty 2

## 2015-10-19 MED ORDER — POTASSIUM CHLORIDE 20 MEQ/15ML (10%) PO SOLN
60.0000 meq | Freq: Once | ORAL | Status: AC
  Administered 2015-10-20: 60 meq via ORAL
  Filled 2015-10-19: qty 45

## 2015-10-19 MED ORDER — POTASSIUM CHLORIDE 10 MEQ/100ML IV SOLN
10.0000 meq | INTRAVENOUS | Status: AC
  Administered 2015-10-19 – 2015-10-20 (×6): 10 meq via INTRAVENOUS
  Filled 2015-10-19 (×6): qty 100

## 2015-10-19 MED ORDER — POTASSIUM CHLORIDE CRYS ER 20 MEQ PO TBCR
40.0000 meq | EXTENDED_RELEASE_TABLET | Freq: Once | ORAL | Status: AC
  Administered 2015-10-19: 40 meq via ORAL

## 2015-10-19 MED ORDER — POTASSIUM CHLORIDE 10 MEQ/100ML IV SOLN: 10.0000 meq | INTRAVENOUS | Status: DC
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
## 2015-10-19 NOTE — ED Provider Notes (Signed)
10 PM: [facility] care [name] PA-C, please see her note for full H&P.  Briefly, patient came in with stiffness of the neck, shoulders, wrists, and hands 1.5 weeks ago with associated paresthesias of hands and wrists with erratic heart palpitations. Labs pending at this time.  Plain films of cervical spine and chest normal.  EKG shows ST ventricular bigeminy.  Hemodynamically stable at present.  Potassium less than 2. Normal magnesium and phosphorus. This is likely the cause of her symptoms. Patient given 80 meq by mouth potassium and 6 runs IV ordered. Plan to admit to medicine for IV potassium repletion and cardiac monitoring.  Appreciate Dr. Toniann FailKakrakandy.       Holly FowlerKayla Alfredo Collymore, PA-C 10/20/15 0003    Holly CoreNathan Pickering, MD 10/20/15 2114

## 2015-10-19 NOTE — ED Notes (Signed)
PER PA KAYLA, IT IS OK TO GIVE THE PT A SANDWICH.

## 2015-10-19 NOTE — ED Triage Notes (Signed)
PT C/O LEFT-SIDED NECK STIFFNESS AND PAIN SINCE LAST Wednesday. DENIES INJURY. PT STATES WHEN SHE MOVES HER ARMS THEY ARE STIFF AND SHAKY. PT ALSO STATES SHE CAN NOT GRIP THINGS IN BOTH HANDS.
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
## 2015-10-20 ENCOUNTER — Encounter (HOSPITAL_COMMUNITY): Payer: Self-pay | Admitting: Internal Medicine

## 2015-10-20 DIAGNOSIS — E878 Other disorders of electrolyte and fluid balance, not elsewhere classified: Secondary | ICD-10-CM | POA: Diagnosis present

## 2015-10-20 DIAGNOSIS — Z823 Family history of stroke: Secondary | ICD-10-CM | POA: Diagnosis not present

## 2015-10-20 DIAGNOSIS — Z8249 Family history of ischemic heart disease and other diseases of the circulatory system: Secondary | ICD-10-CM | POA: Diagnosis not present

## 2015-10-20 DIAGNOSIS — E873 Alkalosis: Secondary | ICD-10-CM | POA: Diagnosis present

## 2015-10-20 DIAGNOSIS — Z975 Presence of (intrauterine) contraceptive device: Secondary | ICD-10-CM | POA: Diagnosis not present

## 2015-10-20 DIAGNOSIS — K219 Gastro-esophageal reflux disease without esophagitis: Secondary | ICD-10-CM | POA: Diagnosis present

## 2015-10-20 DIAGNOSIS — F1210 Cannabis abuse, uncomplicated: Secondary | ICD-10-CM | POA: Diagnosis present

## 2015-10-20 DIAGNOSIS — Z803 Family history of malignant neoplasm of breast: Secondary | ICD-10-CM | POA: Diagnosis not present

## 2015-10-20 DIAGNOSIS — R531 Weakness: Secondary | ICD-10-CM | POA: Diagnosis present

## 2015-10-20 DIAGNOSIS — N390 Urinary tract infection, site not specified: Secondary | ICD-10-CM | POA: Diagnosis present

## 2015-10-20 DIAGNOSIS — E876 Hypokalemia: Secondary | ICD-10-CM | POA: Diagnosis not present

## 2015-10-20 DIAGNOSIS — Z833 Family history of diabetes mellitus: Secondary | ICD-10-CM | POA: Diagnosis not present

## 2015-10-20 DIAGNOSIS — F17290 Nicotine dependence, other tobacco product, uncomplicated: Secondary | ICD-10-CM | POA: Diagnosis present

## 2015-10-20 DIAGNOSIS — I493 Ventricular premature depolarization: Secondary | ICD-10-CM | POA: Diagnosis present

## 2015-10-20 DIAGNOSIS — Z9119 Patient's noncompliance with other medical treatment and regimen: Secondary | ICD-10-CM | POA: Diagnosis not present

## 2015-10-20 LAB — BASIC METABOLIC PANEL
ANION GAP: 12 (ref 5–15)
CHLORIDE: 81 mmol/L — AB (ref 101–111)
CO2: 43 mmol/L — ABNORMAL HIGH (ref 22–32)
Calcium: 8.3 mg/dL — ABNORMAL LOW (ref 8.9–10.3)
Creatinine, Ser: 0.54 mg/dL (ref 0.44–1.00)
GFR calc Af Amer: >60 mL/min (ref >60)
GLUCOSE: 98 mg/dL (ref 65–99)
Sodium: 136 mmol/L (ref 135–145)

## 2015-10-20 LAB — CBC
HCT: 46.9 % — ABNORMAL HIGH (ref 36.0–46.0)
HEMATOCRIT: 41.2 % (ref 36.0–46.0)
HEMOGLOBIN: 14.5 g/dL (ref 12.0–15.0)
Hemoglobin: 16.7 g/dL — ABNORMAL HIGH (ref 12.0–15.0)
MCH: 30.3 pg (ref 26.0–34.0)
MCH: 30.5 pg (ref 26.0–34.0)
MCHC: 35.2 g/dL (ref 30.0–36.0)
MCHC: 35.6 g/dL (ref 30.0–36.0)
MCV: 85.6 fL (ref 78.0–100.0)
MCV: 86.2 fL (ref 78.0–100.0)
PLATELETS: 179 10*3/uL (ref 150–400)
Platelets: 150 10*3/uL (ref 150–400)
RBC: 4.78 MIL/uL (ref 3.87–5.11)
RBC: 5.48 MIL/uL — ABNORMAL HIGH (ref 3.87–5.11)
RDW: 12.7 % (ref 11.5–15.5)
RDW: 12.8 % (ref 11.5–15.5)
WBC: 10.4 10*3/uL (ref 4.0–10.5)
WBC: 12.6 10*3/uL — ABNORMAL HIGH (ref 4.0–10.5)

## 2015-10-20 LAB — CREATININE, SERUM
Creatinine, Ser: 0.67 mg/dL (ref 0.44–1.00)
GFR calc Af Amer: >60 mL/min (ref >60)
GFR calc non Af Amer: >60 mL/min (ref >60)

## 2015-10-20 LAB — NA AND K (SODIUM & POTASSIUM), RAND UR
Potassium Urine: 7 mmol/L
Sodium, Ur: 10 mmol/L

## 2015-10-20 LAB — POTASSIUM
POTASSIUM: 2.2 mmol/L — AB (ref 3.5–5.1)
POTASSIUM: 2.8 mmol/L — AB (ref 3.5–5.1)
Potassium: 2.1 mmol/L — CL (ref 3.5–5.1)
Potassium: 2.2 mmol/L — CL (ref 3.5–5.1)

## 2015-10-20 LAB — HEPATIC FUNCTION PANEL
ALK PHOS: 48 U/L (ref 38–126)
ALT: 49 U/L (ref 14–54)
AST: 103 U/L — AB (ref 15–41)
Albumin: 3.3 g/dL — ABNORMAL LOW (ref 3.5–5.0)
BILIRUBIN DIRECT: 0.2 mg/dL (ref 0.1–0.5)
BILIRUBIN INDIRECT: 1 mg/dL — AB (ref 0.3–0.9)
Total Bilirubin: 1.2 mg/dL (ref 0.3–1.2)
Total Protein: 6.1 g/dL — ABNORMAL LOW (ref 6.5–8.1)

## 2015-10-20 LAB — I-STAT BETA HCG BLOOD, ED (MC, WL, AP ONLY): I-stat hCG, quantitative: <5 m[IU]/mL (ref <5)

## 2015-10-20 LAB — MAGNESIUM: MAGNESIUM: 2 mg/dL (ref 1.7–2.4)

## 2015-10-20 LAB — CHLORIDE, URINE, RANDOM: Chloride Urine: 42 mmol/L

## 2015-10-20 MED ORDER — POTASSIUM CHLORIDE CRYS ER 20 MEQ PO TBCR
40.0000 meq | EXTENDED_RELEASE_TABLET | ORAL | Status: AC
  Administered 2015-10-20 (×3): 40 meq via ORAL
  Filled 2015-10-20 (×3): qty 2

## 2015-10-20 MED ORDER — CEFTRIAXONE SODIUM 1 G IJ SOLR
1.0000 g | Freq: Once | INTRAMUSCULAR | Status: AC
  Administered 2015-10-20: 1 g via INTRAVENOUS
  Filled 2015-10-20: qty 10

## 2015-10-20 MED ORDER — ONDANSETRON HCL 4 MG/2ML IJ SOLN: 4.0000 mg | Freq: Four times a day (QID) | INTRAMUSCULAR | Status: DC | PRN

## 2015-10-20 MED ORDER — POTASSIUM CHLORIDE IN NACL 20-0.9 MEQ/L-% IV SOLN
INTRAVENOUS | Status: AC
  Administered 2015-10-20: 75 mL/h via INTRAVENOUS
  Filled 2015-10-20 (×4): qty 1000

## 2015-10-20 MED ORDER — DEXTROSE 5 % IV SOLN: 1.0000 g | INTRAVENOUS | Status: DC

## 2015-10-20 MED ORDER — POTASSIUM CHLORIDE 20 MEQ/15ML (10%) PO SOLN
40.0000 meq | ORAL | Status: AC
  Administered 2015-10-20 – 2015-10-21 (×3): 40 meq via ORAL
  Filled 2015-10-20 (×3): qty 30

## 2015-10-20 MED ORDER — POTASSIUM CHLORIDE CRYS ER 20 MEQ PO TBCR: 40.0000 meq | EXTENDED_RELEASE_TABLET | Freq: Once | ORAL | Status: DC

## 2015-10-20 MED ORDER — ENOXAPARIN SODIUM 40 MG/0.4ML ~~LOC~~ SOLN
40.0000 mg | SUBCUTANEOUS | Status: DC
  Administered 2015-10-20: 40 mg via SUBCUTANEOUS
  Filled 2015-10-20: qty 0.4

## 2015-10-20 MED ORDER — ONDANSETRON HCL 4 MG PO TABS
4.0000 mg | ORAL_TABLET | Freq: Four times a day (QID) | ORAL | Status: DC | PRN
Start: 2015-10-20 — End: 2015-10-21

## 2015-10-20 MED ORDER — POTASSIUM CHLORIDE 10 MEQ/100ML IV SOLN: 10.0000 meq | INTRAVENOUS | Status: DC

## 2015-10-20 MED ORDER — POTASSIUM CHLORIDE CRYS ER 20 MEQ PO TBCR
40.0000 meq | EXTENDED_RELEASE_TABLET | Freq: Once | ORAL | Status: AC
  Administered 2015-10-20: 40 meq via ORAL
  Filled 2015-10-20: qty 2

## 2015-10-20 MED ORDER — POTASSIUM CHLORIDE 10 MEQ/100ML IV SOLN
10.0000 meq | INTRAVENOUS | Status: AC
  Administered 2015-10-20 (×3): 10 meq via INTRAVENOUS
  Filled 2015-10-20 (×3): qty 100

## 2015-10-20 MED ORDER — ADULT MULTIVITAMIN W/MINERALS CH
1.0000 | ORAL_TABLET | Freq: Every day | ORAL | Status: DC
  Administered 2015-10-20: 1 via ORAL
  Filled 2015-10-20: qty 1

## 2015-10-20 NOTE — ED Notes (Signed)
Patient kept one of the 20mg  potassium pills down but vomited one up. Patient can not tolerate pills well.

## 2015-10-20 NOTE — Progress Notes (Signed)
PHARMACY NOTE -  CEFTRIAXONE  Pharmacy has been consulted to assist with dosing of Ceftriaxone for UTI. Dosage will remain stable at 1gm IV q24h and need for further dosage adjustment appears unlikely at present.    Will sign off at this time.  Please reconsult if a change in clinical status warrants re-evaluation of dosage.  Vale Peraza, PharmD  

## 2015-10-20 NOTE — Progress Notes (Signed)
CSW received consult for community resources. CSW met with patient who states that she is living at her mother-in-law's house who passed away & she inherited the house. Patient states that she is in the process of getting home owners insurance & things needing to be done to the house. CSW provided housing resources & emergency assistance programs. CSW also provided patient with Free Meals & Free Food Pantries booklets.   No further CSW needs identified - CSW signing off.    , LCSW [facility] Clinical Social Worker cell #: [phone]   
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
## 2015-10-20 NOTE — Plan of Care (Signed)
Problem: Safety: Goal: Ability to remain free from injury will improve Outcome: Progressing Reviewed safety plan, call bells, use of non skid socks

## 2015-10-20 NOTE — Progress Notes (Signed)
[facility] PLAN OF CARE NOTE Patient: Holly Black ZOX:[id number]   PCP: Haydee SalterLIU, YAN, MD DOB: 12-03-1980   DOA: 10/19/2015   DOS: 10/20/2015    Patient was admitted by my colleague Dr. Toniann FailKakrakandy  earlier on 10/20/2015. I have reviewed the H&P as well as assessment and plan and agree with the same. Important changes in the plan are listed below.  Plan of care: Active Problems:   Hypokalemia Etiology currently unclear. Continue to replace aggressively. Monitor BMP every 4 hours. Monitor 24-hour urine collection with sodium creatinine and potassium. We will consult nephrology if no improvement in potassium.   Author: Lynden OxfordPranav Patel, MD [facility] Pager: [phone] 10/20/2015 6:27 PM   If 7PM-7AM, please contact night-coverage at [email or web address], password [email or web address]

## 2015-10-20 NOTE — Progress Notes (Signed)
CRITICAL VALUE ALERT  Critical value received:  K+ 2.2  Date of notification:  10/20/15  Time of notification:  1600  Critical value read back: yes  Nurse who received alert:  Dolores FrameAmber Erik Burkett,RN  MD notified (1st page):  Allena KatzPatel  Time of first page:  1615  MD notified (2nd page):  Time of second page:  Responding MD:  patel  Time MD responded:  [phone]

## 2015-10-21 DIAGNOSIS — E876 Hypokalemia: Principal | ICD-10-CM

## 2015-10-21 LAB — MAGNESIUM: Magnesium: 2.1 mg/dL (ref 1.7–2.4)

## 2015-10-21 LAB — POTASSIUM: POTASSIUM: 2.4 mmol/L — AB (ref 3.5–5.1)

## 2015-10-21 LAB — BASIC METABOLIC PANEL
Anion gap: 6 (ref 5–15)
BUN: <5 mg/dL — ABNORMAL LOW (ref 6–20)
CALCIUM: 8 mg/dL — AB (ref 8.9–10.3)
CO2: 36 mmol/L — AB (ref 22–32)
CREATININE: 0.47 mg/dL (ref 0.44–1.00)
Chloride: 97 mmol/L — ABNORMAL LOW (ref 101–111)
Glucose, Bld: 93 mg/dL (ref 65–99)
Potassium: 2.5 mmol/L — CL (ref 3.5–5.1)
SODIUM: 139 mmol/L (ref 135–145)

## 2015-10-21 LAB — HEPATITIS PANEL, ACUTE
HCV Ab: <0.1 s/co ratio (ref 0.0–0.9)
HEP B C IGM: NEGATIVE
Hep A IgM: NEGATIVE
Hepatitis B Surface Ag: NEGATIVE

## 2015-10-21 LAB — HIV ANTIBODY (ROUTINE TESTING W REFLEX): HIV SCREEN 4TH GENERATION: NONREACTIVE

## 2015-10-21 LAB — URINE CULTURE

## 2015-10-21 MED ORDER — POTASSIUM CHLORIDE CRYS ER 20 MEQ PO TBCR: 40.0000 meq | EXTENDED_RELEASE_TABLET | Freq: Once | ORAL | Status: DC

## 2015-10-21 MED ORDER — POTASSIUM CHLORIDE 10 MEQ/100ML IV SOLN: 10.0000 meq | INTRAVENOUS | Status: DC

## 2015-10-21 MED ORDER — POTASSIUM CHLORIDE CRYS ER 20 MEQ PO TBCR
40.0000 meq | EXTENDED_RELEASE_TABLET | ORAL | Status: DC
  Administered 2015-10-21 (×2): 40 meq via ORAL
  Filled 2015-10-21 (×2): qty 2

## 2015-10-21 NOTE — Progress Notes (Signed)
K+ is 2.4 this am. MD notified. SRP,RN

## 2015-10-21 NOTE — Progress Notes (Addendum)
Pt agitated, and refused to wear the heart monitor. demanding the IV to be removed. Asked pt to wait, nurse will follow up with MD. Pt is restless and is ready for discharged. SRP, RN

## 2015-10-21 NOTE — Progress Notes (Signed)
Pt continuous to be agitated and restless, refused to replace monitors. Pt plan of care rounds completed and reviewed with patient. Pt unable to totally comprehend, pt is fixated on one idea and not focusing. Pt asking about going home constantly as the doctor review plan of care with patient. Pt stating she will need to go to her car. Patient has IV and is not safe to leave the floor. Pt refused to listen, continues to move toward the elevators. Security and [employer] assist to reason with patient. Pt continues to refuse to listen and states she plans to leave. Pt agreed to sign the AMA papers and pt left AMA security and [employer] escorted patient out. AC, CN present and aware of situation. SRP, RN

## 2015-10-24 NOTE — Discharge Summary (Signed)
[facility] Discharge Summary   Patient: Holly Black UJW:[id number]   PCP: Haydee SalterLIU, YAN, MD DOB: 1980-12-28   Date of admission: 10/19/2015   Date of discharge: 10/24/2015    Discharge Diagnoses:  Active Problems:   Hypokalemia   UTI (lower urinary tract infection)   Discharge Condition: stable  History of present illness: As per the H and P dictated on admission, " Holly Gantiesha Tetrick is a 35 y.o. female with no significant past medical history presents to the ER because of weakness and paresthesia. Weakness is most specific to the upper extremities with patient having decreased grip strength. Labs reveal severe hypokalemia with EKG showing PVCs. Magnesium levels were normal. Patient does have metabolic alkalosis with hypochloremia. Patient denies any nausea vomiting diarrhea diuretic use or any family history of electrolyte imbalance. Patient states she has never been previously admitted for hypokalemia. Patient states over the last few weeks patient has been eating poorly."  Hospital Course:  Summary of her active problems in the hospital is as following. Active Problems:   Hypokalemia Patient went significant hypokalemic despite aggressive replacement. Patient remained non-compliant with 24-hour urine collection for further workup of identifying etiology of hypokalemia. Patient requested to be discharged home while her potassium level was still critically low. Patient left AMA from the hospital.    UTI (lower urinary tract infection)  No evidence of UTI.  patient left AMA  Procedures and Results:  none   Consultations:  none  The results of significant diagnostics from this hospitalization (including imaging, microbiology, ancillary and laboratory) are listed below for reference.    Significant Diagnostic Studies: Dg Chest 2 View  Result Date: 10/19/2015 CLINICAL DATA:  Neck stiffness extending into the upper extremities bilaterally. Paresthesias in the hands and wrists.  Weakness. EXAM: CHEST  2 VIEW COMPARISON:  None. FINDINGS: The heart size and mediastinal contours are within normal limits. Both lungs are clear. The visualized skeletal structures are unremarkable. IMPRESSION: Negative two view chest x-ray Electronically Signed   By: Marin Robertshristopher  Mattern M.D.   On: 10/19/2015 21:38   Dg Cervical Spine Complete  Result Date: 10/19/2015 CLINICAL DATA:  Neck stiffness extending into the shoulders, wrists and hands beginning 4 days ago. Paresthesias in hands and wrists. Weakness. Lightheadedness. EXAM: CERVICAL SPINE - COMPLETE 4+ VIEW COMPARISON:  None. FINDINGS: The cervical spine is visualized the skullbase through the cervical thoracic junction. The prevertebral soft tissues are within normal limits. Vertebral body heights and alignment are maintained. Endplate changes and mild uncovertebral spurring is evident at C3-4 and C4-5. Mild osseous foraminal narrowing is evident. This is most significant on the left at C4-5. Lung apices are clear. The dens is intact. IMPRESSION: 1. No acute abnormality. 2. Mild degenerative changes are most evident at C3-4 and C4-5 as described. Electronically Signed   By: Marin Robertshristopher  Mattern M.D.   On: 10/19/2015 21:37    Microbiology: Recent Results (from the past 240 hour(s))  Culture, Urine     Status: Abnormal   Collection Time: 10/19/15 10:18 PM  Result Value Ref Range Status   Specimen Description URINE, RANDOM  Final   Special Requests NONE  Final   Culture MULTIPLE SPECIES PRESENT, SUGGEST RECOLLECTION (A)  Final   Report Status 10/21/2015 FINAL  Final     Labs: CBC:  Recent Labs Lab 10/19/15 2100 10/19/15 2351 10/20/15 0548  WBC 12.9* 12.6* 10.4  NEUTROABS 8.9*  --   --   HGB 18.2* 16.7* 14.5  HCT 49.3* 46.9* 41.2  MCV 85.0  85.6 86.2  PLT 177 179 150   Basic Metabolic Panel:  Recent Labs Lab 10/19/15 2100 10/19/15 2351 10/20/15 0548  10/20/15 1505 10/20/15 1849 10/20/15 2325 10/21/15 0533  10/21/15 0704  NA 134*  --  136  --   --   --   --  139  --   K <2.0*  --  <2.0*  < > 2.2* 2.2* 2.8* 2.5* 2.4*  CL 76*  --  81*  --   --   --   --  97*  --   CO2 45*  --  43*  --   --   --   --  36*  --   GLUCOSE 110*  --  98  --   --   --   --  93  --   BUN 6  --  <5*  --   --   --   --  <5*  --   CREATININE 0.68 0.67 0.54  --   --   --   --  0.47  --   CALCIUM 9.1  --  8.3*  --   --   --   --  8.0*  --   MG 2.1  --  2.0  --   --   --   --  2.1  --   PHOS 3.6  --   --   --   --   --   --   --   --   < > = values in this interval not displayed. Liver Function Tests:  Recent Labs Lab 10/19/15 2100 10/20/15 0548  AST 140* 103*  ALT 62* 49  ALKPHOS 67 48  BILITOT 1.5* 1.2  PROT 7.7 6.1*  ALBUMIN 4.2 3.3*   No results for input(s): LIPASE, AMYLASE in the last 168 hours. No results for input(s): AMMONIA in the last 168 hours. Cardiac Enzymes:  Recent Labs Lab 10/19/15 2100  TROPONINI <0.03   BNP (last 3 results) No results for input(s): BNP in the last 8760 hours. CBG: No results for input(s): GLUCAP in the last 168 hours. Time spent: 30 minutes  Signed:  Lynden OxfordEL, Rayquon Uselman  [facility] 10/24/2015, 4:55 PM

## 2017-03-24 IMAGING — CR DG CHEST 2V
2 series · 2 of 2 positions shown · non-contrast
Comparison: None.

CLINICAL DATA: Neck stiffness extending into the upper extremities
bilaterally. Paresthesias in the hands and wrists. Weakness.

EXAM:
CHEST  2 VIEW

[w chest pa]
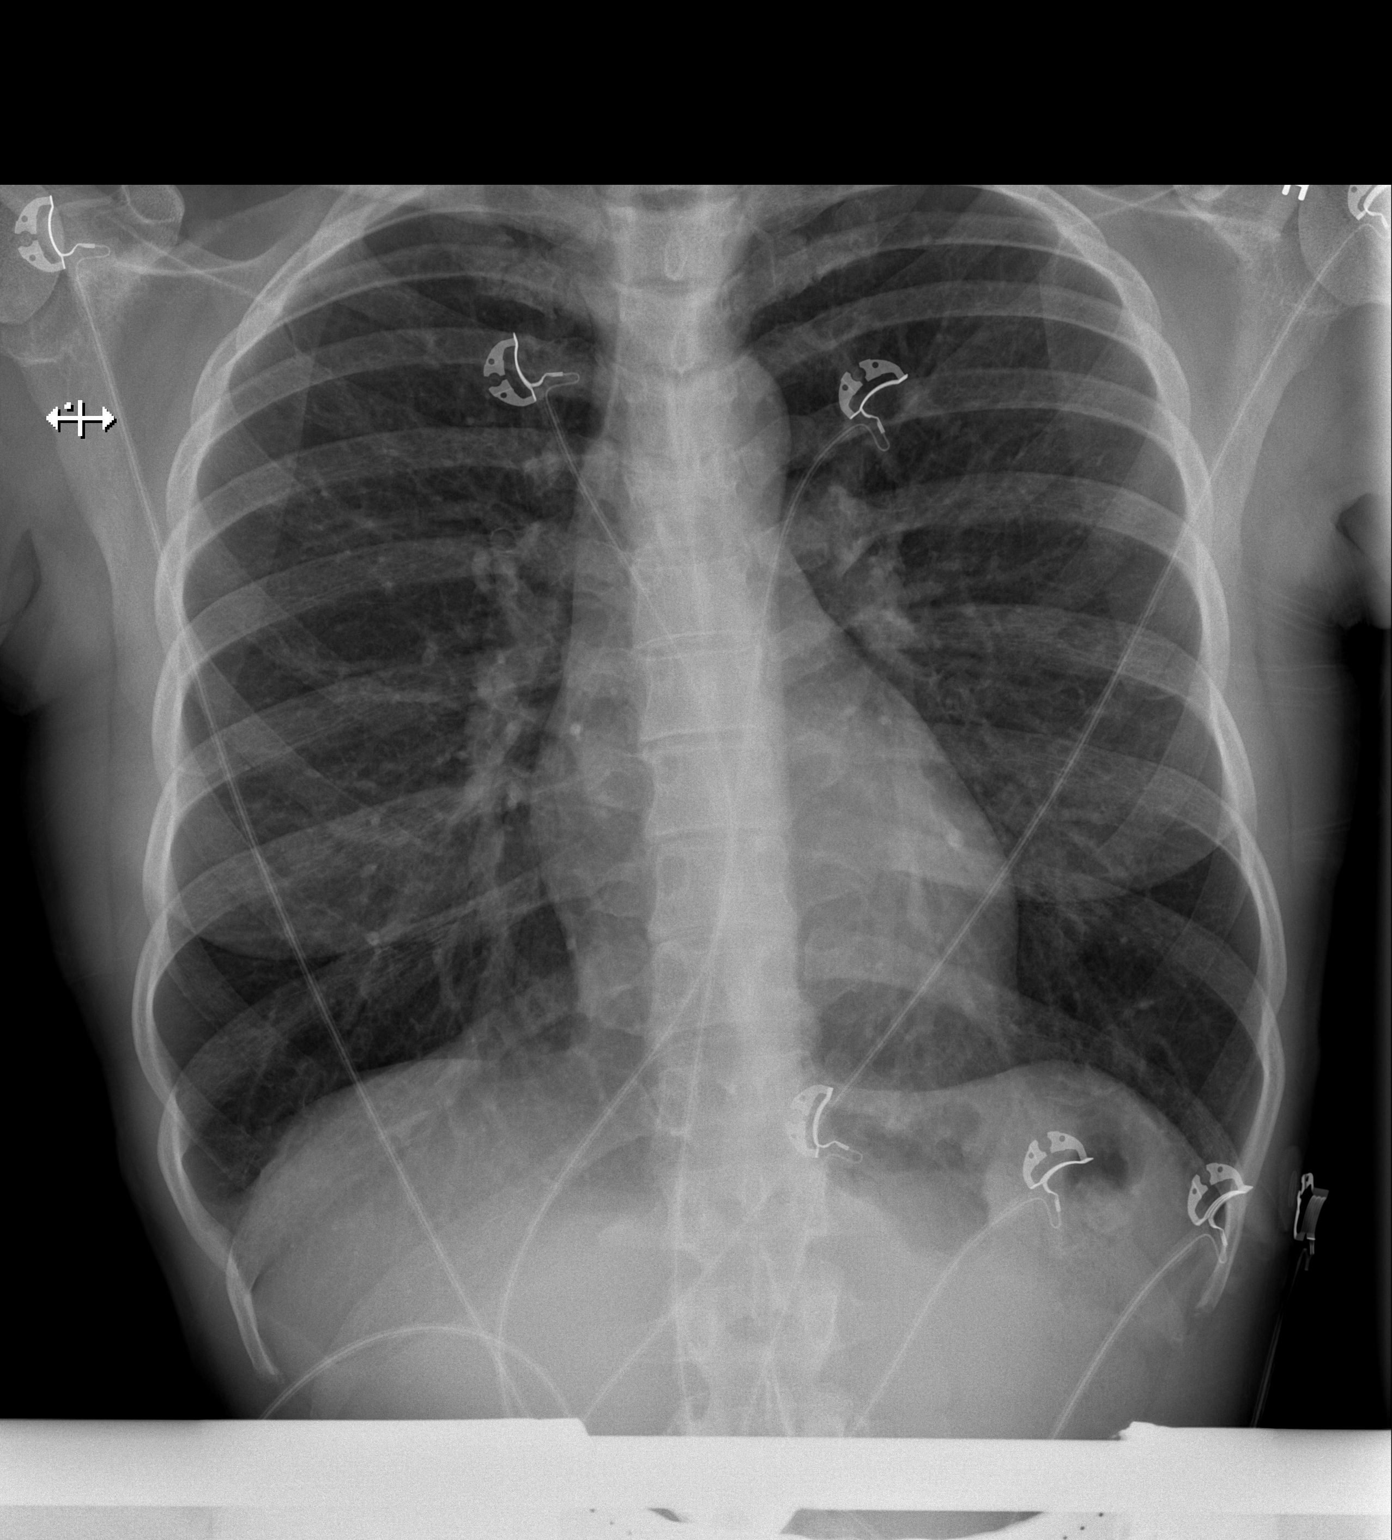

[w chest lat]
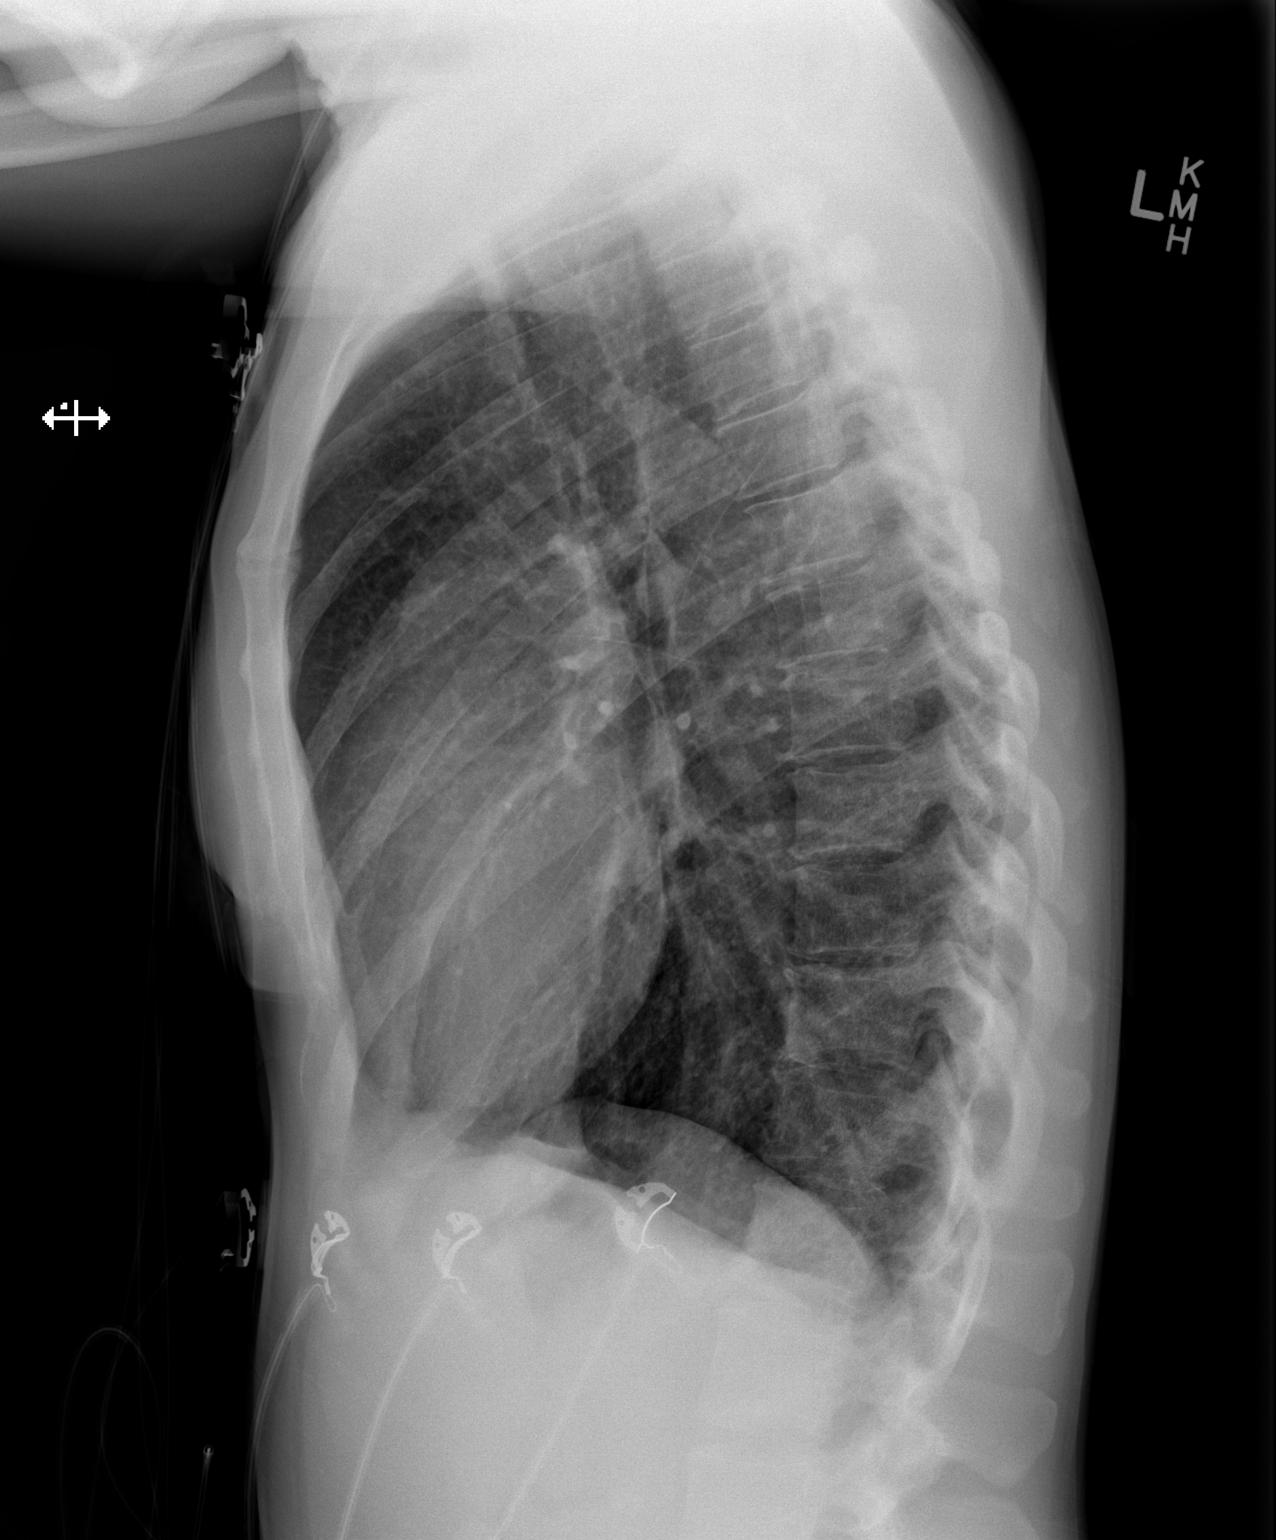

[2 of 2 positions shown; findings below may reference images not displayed]

FINDINGS: The heart size and mediastinal contours are within normal limits.
Both lungs are clear. The visualized skeletal structures are
unremarkable.
IMPRESSION: Negative two view chest x-ray

## 2020-12-11 DIAGNOSIS — I480 Paroxysmal atrial fibrillation: Secondary | ICD-10-CM | POA: Insufficient documentation
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
## 2021-07-12 ENCOUNTER — Ambulatory Visit: Payer: Self-pay | Admitting: Cardiology
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
## 2021-11-11 ENCOUNTER — Other Ambulatory Visit (HOSPITAL_COMMUNITY): Payer: Self-pay

## 2021-11-11 MED ORDER — APIXABAN 5 MG PO TABS
5.0000 mg | ORAL_TABLET | Freq: Two times a day (BID) | ORAL | 1 refills | Status: DC
  Filled 2021-11-11: qty 4, 2d supply, fill #0

## 2021-11-11 MED ORDER — EMPAGLIFLOZIN 10 MG PO TABS
10.0000 mg | ORAL_TABLET | Freq: Every day | ORAL | 3 refills | Status: DC
  Filled 2021-11-11: qty 2, 2d supply, fill #0
  Filled 2021-11-15: qty 30, 30d supply, fill #0

## 2021-11-11 MED ORDER — TRAZODONE HCL 50 MG PO TABS
25.0000 mg | ORAL_TABLET | Freq: Every day | ORAL | 3 refills | Status: DC
  Filled 2021-11-11: qty 2, 4d supply, fill #0
  Filled 2021-11-15: qty 15, 30d supply, fill #0

## 2021-11-11 MED ORDER — SPIRONOLACTONE 25 MG PO TABS
12.5000 mg | ORAL_TABLET | Freq: Every day | ORAL | 2 refills | Status: DC
  Filled 2021-11-11: qty 1, 2d supply, fill #0
  Filled 2021-11-15: qty 15, 30d supply, fill #0
  Filled 2021-12-01: qty 15, 30d supply, fill #1

## 2021-11-11 MED ORDER — PANTOPRAZOLE SODIUM 40 MG PO TBEC
40.0000 mg | DELAYED_RELEASE_TABLET | Freq: Every day | ORAL | 3 refills | Status: DC
  Filled 2021-11-11: qty 2, 2d supply, fill #0
  Filled 2021-11-15: qty 30, 30d supply, fill #0

## 2021-11-11 MED ORDER — FUROSEMIDE 20 MG PO TABS
20.0000 mg | ORAL_TABLET | Freq: Every day | ORAL | 3 refills | Status: DC
  Filled 2021-11-11: qty 2, 2d supply, fill #0
  Filled 2021-11-15: qty 30, 30d supply, fill #0

## 2021-11-11 MED ORDER — PENICILLIN V POTASSIUM 250 MG PO TABS
250.0000 mg | ORAL_TABLET | Freq: Two times a day (BID) | ORAL | 9 refills | Status: DC
  Filled 2021-11-11: qty 4, 2d supply, fill #0
  Filled 2021-11-15 – 2021-12-06 (×3): qty 60, 30d supply, fill #0
  Filled 2022-01-18 – 2022-01-29 (×2): qty 60, 30d supply, fill #1
  Filled 2022-03-06: qty 40, 20d supply, fill #2
  Filled 2022-03-08: qty 20, 10d supply, fill #2
  Filled 2022-03-13 – 2022-06-13 (×4): qty 60, 30d supply, fill #3
  Filled 2022-07-10: qty 60, 30d supply, fill #4
  Filled 2022-07-12: qty 60, 30d supply, fill #0

## 2021-11-11 MED ORDER — NICOTINE 7 MG/24HR TD PT24
7.0000 mg | MEDICATED_PATCH | Freq: Every day | TRANSDERMAL | 0 refills | Status: DC
  Filled 2021-11-15: qty 14, 14d supply, fill #0

## 2021-11-11 MED ORDER — NICOTINE POLACRILEX 2 MG MT GUM: CHEWING_GUM | OROMUCOSAL | 0 refills | Status: DC

## 2021-11-14 ENCOUNTER — Other Ambulatory Visit: Payer: Self-pay

## 2021-11-15 ENCOUNTER — Other Ambulatory Visit: Payer: Self-pay

## 2021-11-15 ENCOUNTER — Other Ambulatory Visit (HOSPITAL_COMMUNITY): Payer: Self-pay

## 2021-11-15 ENCOUNTER — Ambulatory Visit: Payer: Self-pay | Admitting: Physician Assistant

## 2021-11-15 ENCOUNTER — Encounter: Payer: Self-pay | Admitting: Physician Assistant

## 2021-11-15 DIAGNOSIS — I10 Essential (primary) hypertension: Secondary | ICD-10-CM

## 2021-11-15 NOTE — Progress Notes (Signed)
Spoke with patient via telephone, confirmed date of birth.  Patient was actually in line at pharmacy to pick up medications.  On further review of chart, the 2 medications that patient thought that she did need still, methimazole and propanolol, learned that these were picked up by her mother on her behalf.  Patient has upcoming appointment to establish care with Dr. Delford Field on December 06, 2021.  Patient encouraged to present to mobile unit for any needs prior to that  Roney Jaffe, PA-C Physician Assistant [facility] [email or web address]

## 2021-11-16 ENCOUNTER — Other Ambulatory Visit (HOSPITAL_COMMUNITY): Payer: Self-pay

## 2021-11-16 ENCOUNTER — Other Ambulatory Visit: Payer: Self-pay

## 2021-11-23 ENCOUNTER — Other Ambulatory Visit: Payer: Self-pay

## 2021-12-01 ENCOUNTER — Ambulatory Visit: Payer: Medicaid Other | Attending: Critical Care Medicine | Admitting: Critical Care Medicine
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
## 2021-12-02 ENCOUNTER — Other Ambulatory Visit: Payer: Self-pay

## 2021-12-02 ENCOUNTER — Other Ambulatory Visit (HOSPITAL_COMMUNITY): Payer: Self-pay

## 2021-12-06 ENCOUNTER — Ambulatory Visit: Payer: Medicaid Other | Attending: Critical Care Medicine | Admitting: Critical Care Medicine

## 2021-12-06 ENCOUNTER — Other Ambulatory Visit (HOSPITAL_COMMUNITY): Payer: Self-pay

## 2021-12-06 ENCOUNTER — Encounter: Payer: Self-pay | Admitting: Critical Care Medicine

## 2021-12-06 ENCOUNTER — Other Ambulatory Visit: Payer: Self-pay

## 2021-12-06 ENCOUNTER — Ambulatory Visit: Payer: Self-pay | Admitting: Critical Care Medicine

## 2021-12-06 VITALS — BP 112/75 | HR 70 | Ht 64.0 in | Wt 123.6 lb

## 2021-12-06 DIAGNOSIS — Z23 Encounter for immunization: Secondary | ICD-10-CM

## 2021-12-06 DIAGNOSIS — F17290 Nicotine dependence, other tobacco product, uncomplicated: Secondary | ICD-10-CM

## 2021-12-06 DIAGNOSIS — E0590 Thyrotoxicosis, unspecified without thyrotoxic crisis or storm: Secondary | ICD-10-CM

## 2021-12-06 DIAGNOSIS — F17210 Nicotine dependence, cigarettes, uncomplicated: Secondary | ICD-10-CM

## 2021-12-06 DIAGNOSIS — Z139 Encounter for screening, unspecified: Secondary | ICD-10-CM

## 2021-12-06 DIAGNOSIS — I480 Paroxysmal atrial fibrillation: Secondary | ICD-10-CM

## 2021-12-06 DIAGNOSIS — I5022 Chronic systolic (congestive) heart failure: Secondary | ICD-10-CM | POA: Insufficient documentation

## 2021-12-06 DIAGNOSIS — J851 Abscess of lung with pneumonia: Secondary | ICD-10-CM

## 2021-12-06 DIAGNOSIS — Z720 Tobacco use: Secondary | ICD-10-CM

## 2021-12-06 DIAGNOSIS — E876 Hypokalemia: Secondary | ICD-10-CM

## 2021-12-06 DIAGNOSIS — Z131 Encounter for screening for diabetes mellitus: Secondary | ICD-10-CM

## 2021-12-06 MED ORDER — PROPRANOLOL HCL 10 MG PO TABS
10.0000 mg | ORAL_TABLET | Freq: Three times a day (TID) | ORAL | 4 refills | Status: DC
  Filled 2021-12-06 – 2022-01-13 (×2): qty 90, 30d supply, fill #0
  Filled 2022-03-23 (×2): qty 90, 30d supply, fill #1
  Filled 2022-04-24: qty 90, 30d supply, fill #2
  Filled 2022-05-14 – 2022-06-13 (×4): qty 90, 30d supply, fill #3
  Filled 2022-07-10 – 2022-07-11 (×2): qty 90, 30d supply, fill #4
  Filled 2022-07-12: qty 90, 30d supply, fill #0

## 2021-12-06 MED ORDER — FUROSEMIDE 20 MG PO TABS
20.0000 mg | ORAL_TABLET | Freq: Every day | ORAL | 3 refills | Status: DC
  Filled 2021-12-06 – 2021-12-15 (×2): qty 30, 30d supply, fill #0
  Filled 2021-12-27 – 2022-01-29 (×3): qty 30, 30d supply, fill #1
  Filled 2022-02-18 – 2022-02-24 (×3): qty 30, 30d supply, fill #2
  Filled 2022-03-06 – 2022-03-13 (×3): qty 30, 30d supply, fill #3
  Filled 2022-03-23: qty 30, 30d supply, fill #2
  Filled 2022-04-12 – 2022-04-24 (×3): qty 30, 30d supply, fill #3

## 2021-12-06 MED ORDER — PANTOPRAZOLE SODIUM 40 MG PO TBEC
40.0000 mg | DELAYED_RELEASE_TABLET | Freq: Every day | ORAL | 3 refills | Status: DC
  Filled 2021-12-06 – 2021-12-27 (×2): qty 60, 60d supply, fill #0
  Filled 2022-02-18 – 2022-03-06 (×3): qty 60, 60d supply, fill #1
  Filled 2022-03-07: qty 30, 30d supply, fill #1
  Filled 2022-03-13 – 2022-04-24 (×3): qty 30, 30d supply, fill #2
  Filled 2022-06-04 – 2022-06-13 (×2): qty 30, 30d supply, fill #3
  Filled 2022-06-29 – 2022-07-11 (×4): qty 30, 30d supply, fill #4
  Filled 2022-07-12: qty 30, 30d supply, fill #0
  Filled 2022-08-28 – 2022-09-04 (×4): qty 30, 30d supply, fill #1
  Filled 2022-09-14 – 2022-10-07 (×2): qty 30, 30d supply, fill #2

## 2021-12-06 MED ORDER — EMPAGLIFLOZIN 10 MG PO TABS
10.0000 mg | ORAL_TABLET | Freq: Every day | ORAL | 3 refills | Status: DC
  Filled 2021-12-06 – 2022-03-23 (×7): qty 30, 30d supply, fill #0

## 2021-12-06 MED ORDER — TRAZODONE HCL 50 MG PO TABS
25.0000 mg | ORAL_TABLET | Freq: Every day | ORAL | 3 refills | Status: DC
  Filled 2021-12-06: qty 30, 60d supply, fill #0
  Filled 2021-12-27: qty 15, 30d supply, fill #0
  Filled 2022-01-18 – 2022-01-29 (×2): qty 15, 30d supply, fill #1
  Filled 2022-02-23 – 2022-02-24 (×2): qty 15, 30d supply, fill #2
  Filled 2022-03-06 – 2022-03-13 (×2): qty 15, 30d supply, fill #3
  Filled 2022-03-23: qty 15, 30d supply, fill #2
  Filled 2022-04-12 – 2022-04-24 (×3): qty 15, 30d supply, fill #3
  Filled 2022-06-04 – 2022-06-13 (×2): qty 15, 30d supply, fill #4
  Filled 2022-06-29 – 2022-07-11 (×3): qty 15, 30d supply, fill #5
  Filled 2022-07-12: qty 15, 30d supply, fill #0
  Filled 2022-08-28 – 2022-09-04 (×3): qty 15, 30d supply, fill #1
  Filled 2022-09-14 – 2022-10-07 (×2): qty 15, 30d supply, fill #2

## 2021-12-06 MED ORDER — METHIMAZOLE 10 MG PO TABS
20.0000 mg | ORAL_TABLET | Freq: Every day | ORAL | 4 refills | Status: DC
  Filled 2021-12-06: qty 60, 30d supply, fill #0
  Filled 2021-12-27 – 2021-12-29 (×2): qty 60, 30d supply, fill #1
  Filled 2022-01-18 – 2022-01-29 (×2): qty 60, 30d supply, fill #2
  Filled 2022-02-23 – 2022-02-24 (×2): qty 60, 30d supply, fill #3
  Filled 2022-03-06 – 2022-03-13 (×3): qty 60, 30d supply, fill #4
  Filled 2022-03-23: qty 60, 30d supply, fill #3
  Filled 2022-04-12 – 2022-04-24 (×3): qty 60, 30d supply, fill #4

## 2021-12-06 MED ORDER — TRETINOIN 0.1 % EX CREA
1.0000 "application " | TOPICAL_CREAM | Freq: Every day | CUTANEOUS | 4 refills | Status: DC
  Filled 2021-12-06: qty 20, 20d supply, fill #0
  Filled 2022-01-01: qty 20, 20d supply, fill #1
  Filled 2022-03-13 – 2022-03-23 (×2): qty 45, 30d supply, fill #2
  Filled 2022-05-17 – 2022-05-18 (×2): qty 20, 20d supply, fill #3
  Filled 2022-07-10: qty 45, 30d supply, fill #3
  Filled 2022-07-12: qty 45, 30d supply, fill #0
  Filled 2022-10-21: qty 45, 30d supply, fill #1

## 2021-12-06 MED ORDER — SPIRONOLACTONE 25 MG PO TABS
12.5000 mg | ORAL_TABLET | Freq: Every day | ORAL | 2 refills | Status: DC
  Filled 2021-12-06 – 2022-01-02 (×2): qty 45, 90d supply, fill #0
  Filled 2022-02-18 – 2022-03-23 (×6): qty 45, 90d supply, fill #1
  Filled 2022-05-14 – 2022-06-13 (×3): qty 45, 90d supply, fill #2

## 2021-12-06 NOTE — Assessment & Plan Note (Signed)
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

## 2021-12-06 NOTE — Addendum Note (Signed)
Addended by: Mariana Arn on: 12/06/2021 02:06 PM   Modules accepted: Orders
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
## 2021-12-06 NOTE — Assessment & Plan Note (Signed)
Prior history of hypokalemia will reassess metabolic panel

## 2021-12-06 NOTE — Assessment & Plan Note (Signed)
Continue methimazole and propranolol and reassess thyroid function may yet need endocrinology input

## 2021-12-06 NOTE — Assessment & Plan Note (Signed)
Has had 2 echoes 1 during atrial fibrillation EF 30% follow-up echo was normal we will need to refer to cardiology to reestablish baseline and whether she should be on Eliquis or not

## 2021-12-06 NOTE — Assessment & Plan Note (Signed)
History of paroxysmal atrial fibrillation currently in sinus rhythm on propranolol off Eliquis likely due to thyroid storm  Plan to refer to cardiology maintain propranolol for now

## 2021-12-06 NOTE — Patient Instructions (Signed)
Flu vaccine and tetanus shot given  Refills on medications sent to our pharmacy downstairs  Referral to cardiology was made for your heart condition  Obtain a chest x-ray today to follow-up lung infection  Please identify who you are following for your penicillin prescription so we can communicate with that provider  Let us know who hospitalized you into room so we can obtain records  Stop smoking and use nicotine patches for this  A Pap smear appointment will be obtained  Screening labs will be obtained at this visit  Return to Dr. Joya Gaskins 3 months

## 2021-12-06 NOTE — Assessment & Plan Note (Signed)
  .   Current smoking consumption amount: 3 cigarettes a day  . Dicsussion on advise to quit smoking and smoking impacts:  Cardiovascular impacts . Patient's willingness to quit: Ready to quit  . Methods to quit smoking discussed: Need for nicotine replacement  . Medication management of smoking session drugs discussed: Nicotine lozenge  . Resources provided:  AVS   . Setting quit date not established  . Follow-up arranged 3 months   Time spent counseling the patient: 5 minutes

## 2021-12-07 ENCOUNTER — Other Ambulatory Visit: Payer: Self-pay

## 2021-12-07 ENCOUNTER — Other Ambulatory Visit: Payer: Self-pay | Admitting: Critical Care Medicine

## 2021-12-07 ENCOUNTER — Telehealth: Payer: Self-pay

## 2021-12-07 DIAGNOSIS — E782 Mixed hyperlipidemia: Secondary | ICD-10-CM | POA: Insufficient documentation

## 2021-12-07 LAB — COMPREHENSIVE METABOLIC PANEL
ALT: 8 IU/L (ref 0–32)
AST: 12 IU/L (ref 0–40)
Albumin/Globulin Ratio: 1.4 (ref 1.2–2.2)
Albumin: 4.4 g/dL (ref 3.9–4.9)
Alkaline Phosphatase: 251 IU/L — ABNORMAL HIGH (ref 44–121)
BUN/Creatinine Ratio: 7 — ABNORMAL LOW (ref 9–23)
BUN: 9 mg/dL (ref 6–24)
Bilirubin Total: 0.9 mg/dL (ref 0.0–1.2)
CO2: 24 mmol/L (ref 20–29)
Calcium: 9.5 mg/dL (ref 8.7–10.2)
Chloride: 98 mmol/L (ref 96–106)
Creatinine, Ser: 1.24 mg/dL — ABNORMAL HIGH (ref 0.57–1.00)
Globulin, Total: 3.1 g/dL (ref 1.5–4.5)
Glucose: 85 mg/dL (ref 70–99)
Potassium: 4.2 mmol/L (ref 3.5–5.2)
Sodium: 137 mmol/L (ref 134–144)
Total Protein: 7.5 g/dL (ref 6.0–8.5)
eGFR: 56 mL/min/{1.73_m2} — ABNORMAL LOW (ref >59)

## 2021-12-07 LAB — LIPID PANEL
Chol/HDL Ratio: 5.2 ratio — ABNORMAL HIGH (ref 0.0–4.4)
Cholesterol, Total: 224 mg/dL — ABNORMAL HIGH (ref 100–199)
HDL: 43 mg/dL (ref >39)
LDL Chol Calc (NIH): 142 mg/dL — ABNORMAL HIGH (ref 0–99)
Triglycerides: 216 mg/dL — ABNORMAL HIGH (ref 0–149)
VLDL Cholesterol Cal: 39 mg/dL (ref 5–40)

## 2021-12-07 LAB — THYROID PANEL WITH TSH
Free Thyroxine Index: 1.1 — ABNORMAL LOW (ref 1.2–4.9)
T3 Uptake Ratio: 22 % — ABNORMAL LOW (ref 24–39)
T4, Total: 5.2 ug/dL (ref 4.5–12.0)
TSH: 0.078 u[IU]/mL — ABNORMAL LOW (ref 0.450–4.500)

## 2021-12-07 LAB — CBC WITH DIFFERENTIAL/PLATELET
Basophils Absolute: 0.1 10*3/uL (ref 0.0–0.2)
Basos: 1 %
EOS (ABSOLUTE): 0.2 10*3/uL (ref 0.0–0.4)
Eos: 2 %
Hematocrit: 44.9 % (ref 34.0–46.6)
Hemoglobin: 14.8 g/dL (ref 11.1–15.9)
Immature Grans (Abs): 0 10*3/uL (ref 0.0–0.1)
Immature Granulocytes: 0 %
Lymphocytes Absolute: 1.1 10*3/uL (ref 0.7–3.1)
Lymphs: 15 %
MCH: 28.6 pg (ref 26.6–33.0)
MCHC: 33 g/dL (ref 31.5–35.7)
MCV: 87 fL (ref 79–97)
Monocytes Absolute: 0.8 10*3/uL (ref 0.1–0.9)
Monocytes: 10 %
Neutrophils Absolute: 5.2 10*3/uL (ref 1.4–7.0)
Neutrophils: 72 %
Platelets: 354 10*3/uL (ref 150–450)
RBC: 5.17 x10E6/uL (ref 3.77–5.28)
RDW: 12.6 % (ref 11.7–15.4)
WBC: 7.3 10*3/uL (ref 3.4–10.8)

## 2021-12-07 LAB — HEMOGLOBIN A1C
Est. average glucose Bld gHb Est-mCnc: 97 mg/dL
Hgb A1c MFr Bld: 5 % (ref 4.8–5.6)

## 2021-12-07 MED ORDER — ROSUVASTATIN CALCIUM 20 MG PO TABS
20.0000 mg | ORAL_TABLET | Freq: Every day | ORAL | 3 refills | Status: DC
  Filled 2021-12-07: qty 90, 90d supply, fill #0

## 2021-12-07 NOTE — Telephone Encounter (Signed)
-----   Message from Elsie Stain, MD sent at 12/07/2021  9:07 AM EDT ----- Let pt know thyroid is normal and not hyperactive, let pt know cholesterol is very high I sent cholesterol pill to  our pharmacy, kidney, liver, stable, potassium normal,  no diabetes

## 2021-12-07 NOTE — Progress Notes (Signed)
Let pt know thyroid is normal and not hyperactive, let pt know cholesterol is very high I sent cholesterol pill to  our pharmacy, kidney, liver, stable, potassium normal,  no diabetes

## 2021-12-07 NOTE — Telephone Encounter (Signed)
Pt was called and vm was left, Information has been sent to nurse pool.   

## 2021-12-12 ENCOUNTER — Other Ambulatory Visit: Payer: Self-pay

## 2021-12-14 ENCOUNTER — Other Ambulatory Visit: Payer: Self-pay

## 2021-12-15 ENCOUNTER — Other Ambulatory Visit: Payer: Self-pay

## 2021-12-16 ENCOUNTER — Encounter: Payer: Self-pay | Admitting: *Deleted

## 2021-12-20 ENCOUNTER — Other Ambulatory Visit: Payer: Self-pay

## 2021-12-26 ENCOUNTER — Ambulatory Visit: Payer: Medicaid Other | Admitting: Interventional Cardiology

## 2021-12-27 ENCOUNTER — Other Ambulatory Visit: Payer: Self-pay

## 2021-12-28 ENCOUNTER — Telehealth: Payer: Self-pay

## 2021-12-28 ENCOUNTER — Other Ambulatory Visit: Payer: Self-pay

## 2021-12-28 NOTE — Telephone Encounter (Signed)
Patient called with Medication questions. She is due for a refill and wanted to know if she had refills available at her desired [facility]. Her medication list was reviewed and she was informed that all medications were refilled by Dr.Wright on 10.03.2023 and she has remaining refills at her pharmacy. Patient is in understanding, and has no further questions at this time.

## 2021-12-29 ENCOUNTER — Other Ambulatory Visit: Payer: Self-pay

## 2021-12-30 ENCOUNTER — Other Ambulatory Visit: Payer: Self-pay

## 2022-01-02 ENCOUNTER — Other Ambulatory Visit: Payer: Self-pay

## 2022-01-03 ENCOUNTER — Other Ambulatory Visit: Payer: Self-pay

## 2022-01-05 ENCOUNTER — Other Ambulatory Visit: Payer: Self-pay

## 2022-01-05 ENCOUNTER — Telehealth: Payer: Self-pay

## 2022-01-05 NOTE — Telephone Encounter (Signed)
Message received from Salena Saner, EMT -   Nira Conn was meeting with the patient's friend and Oaklynn said she  is trying to get re-certified for her food stamps and they are requesting a letter from her provider stating that she cannot work due to her medical conditions for her to proceed with the food stamps recert.   Please advise as Dr Joya Gaskins is out of the office.

## 2022-01-06 NOTE — Telephone Encounter (Signed)
It looks like from her chart Dr. Joya Gaskins had referred her to cardiology and cardiology notes indicate they had been unsuccessful in reaching the patient.   I am unable to determine her ability or inability to work given she has never had a visit with me and her chart does not indicate this.  A visit with cardiology will give [place] a better picture. I also see she was scheduled with [facility] for Pap smear in 04/2022, this will need to be scheduled with another clinician as [facility] does not perform Pap smears.  Thank you.

## 2022-01-09 ENCOUNTER — Other Ambulatory Visit: Payer: Self-pay

## 2022-01-09 NOTE — Telephone Encounter (Signed)
I re-scheduled her PAP for the same day but with Dr Wynetta Emery .

## 2022-01-11 ENCOUNTER — Telehealth: Payer: Self-pay

## 2022-01-11 NOTE — Telephone Encounter (Signed)
Ms. Cantrell called to confirm that her medication Propranolol (Inderal) 10 mg has been sent to [facility]. Patient was informed that medication was sent over to the [facility] pharmacy on 10.3.23, but still shows never dispensed status. Patient was advised to call [facility] if she has any problems

## 2022-01-13 ENCOUNTER — Other Ambulatory Visit: Payer: Self-pay

## 2022-01-18 ENCOUNTER — Other Ambulatory Visit: Payer: Self-pay

## 2022-01-19 ENCOUNTER — Other Ambulatory Visit: Payer: Self-pay

## 2022-01-20 ENCOUNTER — Other Ambulatory Visit: Payer: Self-pay

## 2022-01-27 ENCOUNTER — Other Ambulatory Visit: Payer: Self-pay

## 2022-01-30 ENCOUNTER — Other Ambulatory Visit: Payer: Self-pay

## 2022-02-03 ENCOUNTER — Other Ambulatory Visit: Payer: Self-pay

## 2022-02-20 ENCOUNTER — Other Ambulatory Visit: Payer: Self-pay

## 2022-02-24 ENCOUNTER — Other Ambulatory Visit: Payer: Self-pay

## 2022-02-28 ENCOUNTER — Other Ambulatory Visit: Payer: Self-pay

## 2022-03-01 ENCOUNTER — Other Ambulatory Visit: Payer: Self-pay

## 2022-03-06 ENCOUNTER — Other Ambulatory Visit: Payer: Self-pay

## 2022-03-07 ENCOUNTER — Other Ambulatory Visit: Payer: Self-pay

## 2022-03-08 ENCOUNTER — Other Ambulatory Visit: Payer: Self-pay

## 2022-03-09 ENCOUNTER — Ambulatory Visit: Payer: Medicaid Other | Attending: Critical Care Medicine | Admitting: Critical Care Medicine

## 2022-03-09 ENCOUNTER — Other Ambulatory Visit: Payer: Self-pay
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
## 2022-03-10 ENCOUNTER — Other Ambulatory Visit: Payer: Self-pay

## 2022-03-13 ENCOUNTER — Other Ambulatory Visit: Payer: Self-pay

## 2022-03-14 ENCOUNTER — Other Ambulatory Visit: Payer: Self-pay

## 2022-03-15 ENCOUNTER — Other Ambulatory Visit: Payer: Self-pay

## 2022-03-16 ENCOUNTER — Other Ambulatory Visit: Payer: Self-pay

## 2022-03-20 ENCOUNTER — Other Ambulatory Visit: Payer: Self-pay

## 2022-03-21 ENCOUNTER — Other Ambulatory Visit: Payer: Self-pay

## 2022-03-23 ENCOUNTER — Other Ambulatory Visit: Payer: Self-pay

## 2022-03-24 ENCOUNTER — Other Ambulatory Visit: Payer: Self-pay

## 2022-03-27 ENCOUNTER — Other Ambulatory Visit: Payer: Self-pay

## 2022-03-27 ENCOUNTER — Ambulatory Visit: Payer: Medicaid Other | Admitting: Internal Medicine

## 2022-03-28 ENCOUNTER — Other Ambulatory Visit: Payer: Self-pay

## 2022-04-06 ENCOUNTER — Ambulatory Visit: Payer: Self-pay | Admitting: Physician Assistant

## 2022-04-06 ENCOUNTER — Ambulatory Visit: Payer: Medicaid Other | Admitting: Internal Medicine

## 2022-04-12 ENCOUNTER — Other Ambulatory Visit: Payer: Self-pay

## 2022-04-17 ENCOUNTER — Other Ambulatory Visit: Payer: Self-pay

## 2022-04-24 ENCOUNTER — Other Ambulatory Visit: Payer: Self-pay

## 2022-05-05 ENCOUNTER — Other Ambulatory Visit (HOSPITAL_COMMUNITY): Payer: Self-pay

## 2022-05-05 ENCOUNTER — Other Ambulatory Visit: Payer: Self-pay

## 2022-05-08 ENCOUNTER — Other Ambulatory Visit (HOSPITAL_COMMUNITY): Payer: Self-pay

## 2022-05-08 MED ORDER — NICOTINE 7 MG/24HR TD PT24
7.0000 mg | MEDICATED_PATCH | Freq: Every day | TRANSDERMAL | 0 refills | Status: DC
  Filled 2022-05-08 – 2022-07-12 (×5): qty 14, 14d supply, fill #0

## 2022-05-14 ENCOUNTER — Other Ambulatory Visit: Payer: Self-pay | Admitting: Critical Care Medicine

## 2022-05-15 ENCOUNTER — Other Ambulatory Visit: Payer: Self-pay

## 2022-05-15 MED ORDER — FUROSEMIDE 20 MG PO TABS
20.0000 mg | ORAL_TABLET | Freq: Every day | ORAL | 0 refills | Status: DC
  Filled 2022-05-15 – 2022-06-13 (×4): qty 30, 30d supply, fill #0

## 2022-05-17 ENCOUNTER — Other Ambulatory Visit: Payer: Self-pay

## 2022-05-17 ENCOUNTER — Other Ambulatory Visit: Payer: Self-pay | Admitting: Critical Care Medicine

## 2022-05-18 ENCOUNTER — Other Ambulatory Visit: Payer: Self-pay

## 2022-05-18 MED ORDER — METHIMAZOLE 10 MG PO TABS
20.0000 mg | ORAL_TABLET | Freq: Every day | ORAL | 0 refills | Status: DC
  Filled 2022-05-18 – 2022-06-13 (×4): qty 60, 30d supply, fill #0

## 2022-05-18 NOTE — Telephone Encounter (Signed)
Requested medication (s) are due for refill today -yes  Requested medication (s) are on the active medication list -yes  Future visit scheduled -no  Last refill: 12/06/21 #60 4RF  Notes to clinic: non delegated Rx  Requested Prescriptions  Pending Prescriptions Disp Refills   methimazole (TAPAZOLE) 10 MG tablet 60 tablet 4    Sig: Take 2 tablets (20 mg total) by mouth daily.     Not Delegated - Endocrinology:  Hyperthyroid Agents Failed - 05/17/2022  4:46 PM      Failed - This refill cannot be delegated      Failed - TSH in normal range and within 180 days    TSH  Date Value Ref Range Status  12/06/2021 0.078 (L) 0.450 - 4.500 uIU/mL Final         Failed - T3 Total in normal range and within 180 days    No results found for: "T3TOTAL", "T3FREE"       Passed - T4 free in normal range and within 180 days    T4, Total  Date Value Ref Range Status  12/06/2021 5.2 4.5 - 12.0 ug/dL Final   Free T4  Date Value Ref Range Status  08/12/2007 1.02 0.89 - 1.80 ng/dL Final         Passed - Valid encounter within last 6 months    Recent Outpatient Visits           5 months ago Hyperthyroidism   [place] [facility], [place]                 Requested Prescriptions  Pending Prescriptions Disp Refills   methimazole (TAPAZOLE) 10 MG tablet 60 tablet 4    Sig: Take 2 tablets (20 mg total) by mouth daily.     Not Delegated - Endocrinology:  Hyperthyroid Agents Failed - 05/17/2022  4:46 PM      Failed - This refill cannot be delegated      Failed - TSH in normal range and within 180 days    TSH  Date Value Ref Range Status  12/06/2021 0.078 (L) 0.450 - 4.500 uIU/mL Final         Failed - T3 Total in normal range and within 180 days    No results found for: "T3TOTAL", "T3FREE"       Passed - T4 free in normal range and within 180 days    T4, Total  Date Value Ref Range Status  12/06/2021 5.2 4.5 - 12.0 ug/dL Final   Free T4   Date Value Ref Range Status  08/12/2007 1.02 0.89 - 1.80 ng/dL Final         Passed - Valid encounter within last 6 months    Recent Outpatient Visits           5 months ago Hyperthyroidism   [name], [place]

## 2022-05-19 ENCOUNTER — Other Ambulatory Visit: Payer: Self-pay

## 2022-05-22 ENCOUNTER — Other Ambulatory Visit: Payer: Self-pay

## 2022-05-23 ENCOUNTER — Other Ambulatory Visit: Payer: Self-pay

## 2022-05-26 ENCOUNTER — Other Ambulatory Visit: Payer: Self-pay

## 2022-05-29 ENCOUNTER — Other Ambulatory Visit (HOSPITAL_COMMUNITY): Payer: Self-pay

## 2022-05-29 ENCOUNTER — Other Ambulatory Visit: Payer: Self-pay

## 2022-05-30 ENCOUNTER — Other Ambulatory Visit: Payer: Self-pay

## 2022-06-05 ENCOUNTER — Other Ambulatory Visit: Payer: Self-pay

## 2022-06-09 ENCOUNTER — Other Ambulatory Visit: Payer: Self-pay

## 2022-06-12 ENCOUNTER — Other Ambulatory Visit: Payer: Self-pay

## 2022-06-13 ENCOUNTER — Other Ambulatory Visit: Payer: Self-pay

## 2022-06-29 ENCOUNTER — Other Ambulatory Visit: Payer: Self-pay

## 2022-06-29 ENCOUNTER — Other Ambulatory Visit (HOSPITAL_COMMUNITY): Payer: Self-pay

## 2022-06-29 ENCOUNTER — Other Ambulatory Visit: Payer: Self-pay | Admitting: Critical Care Medicine

## 2022-06-29 NOTE — Telephone Encounter (Signed)
Requested medication (s) are due for refill today:   Yes.  Tapazole provider to review  Requested medication (s) are on the active medication list:   Yes for all 3  Future visit scheduled:   No   Last ordered: Lasix 05/15/2022 #30, 0 refills;   Tapazole 10 mg 05/18/2022 #60, 0 refills;   Aldactone 25 mg 12/06/2021 #60, 3 refills.  Returned due to labs being due for all 3 and Tapazole being a non delegated refill    Requested Prescriptions  Pending Prescriptions Disp Refills   furosemide (LASIX) 20 MG tablet 30 tablet 0    Sig: Take 1 tablet (20 mg total) by mouth daily. Must have office visit for refills     Cardiovascular:  Diuretics - Loop Failed - 06/29/2022  1:04 PM      Failed - K in normal range and within 180 days    Potassium  Date Value Ref Range Status  12/06/2021 4.2 3.5 - 5.2 mmol/L Final         Failed - Ca in normal range and within 180 days    Calcium  Date Value Ref Range Status  12/06/2021 9.5 8.7 - 10.2 mg/dL Final         Failed - Na in normal range and within 180 days    Sodium  Date Value Ref Range Status  12/06/2021 137 134 - 144 mmol/L Final         Failed - Cr in normal range and within 180 days    Creatinine, Ser  Date Value Ref Range Status  12/06/2021 1.24 (H) 0.57 - 1.00 mg/dL Final         Failed - Cl in normal range and within 180 days    Chloride  Date Value Ref Range Status  12/06/2021 98 96 - 106 mmol/L Final         Failed - Mg Level in normal range and within 180 days    Magnesium  Date Value Ref Range Status  10/21/2015 2.1 1.7 - 2.4 mg/dL Final         Failed - Valid encounter within last 6 months    Recent Outpatient Visits           6 months ago Hyperthyroidism   [facility] Storm Frisk, MD              Passed - Last BP in normal range    BP Readings from Last 1 Encounters:  12/06/21 112/75          methimazole (TAPAZOLE) 10 MG tablet 60 tablet 0    Sig: Take 2 tablets  (20 mg total) by mouth daily. Please make appt.     Not Delegated - Endocrinology:  Hyperthyroid Agents Failed - 06/29/2022  1:04 PM      Failed - This refill cannot be delegated      Failed - TSH in normal range and within 180 days    TSH  Date Value Ref Range Status  12/06/2021 0.078 (L) 0.450 - 4.500 uIU/mL Final         Failed - T3 Total in normal range and within 180 days    No results found for: "T3TOTAL", "T3FREE"       Failed - T4 free in normal range and within 180 days    T4, Total  Date Value Ref Range Status  12/06/2021 5.2 4.5 - 12.0 ug/dL Final   Free T4  Date Value Ref Range Status  08/12/2007 1.02 0.89 - 1.80 ng/dL Final         Failed - Valid encounter within last 6 months    Recent Outpatient Visits           6 months ago Hyperthyroidism   [facility] Storm Frisk, MD               spironolactone (ALDACTONE) 25 MG tablet 45 tablet 2    Sig: Take 0.5 tablets (12.5 mg total) by mouth daily.     Cardiovascular: Diuretics - Aldosterone Antagonist Failed - 06/29/2022  1:04 PM      Failed - Cr in normal range and within 180 days    Creatinine, Ser  Date Value Ref Range Status  12/06/2021 1.24 (H) 0.57 - 1.00 mg/dL Final         Failed - K in normal range and within 180 days    Potassium  Date Value Ref Range Status  12/06/2021 4.2 3.5 - 5.2 mmol/L Final         Failed - Na in normal range and within 180 days    Sodium  Date Value Ref Range Status  12/06/2021 137 134 - 144 mmol/L Final         Failed - eGFR is 30 or above and within 180 days    GFR calc Af Amer  Date Value Ref Range Status  10/21/2015 >60 >60 mL/min Final    Comment:    (NOTE) The eGFR has been calculated using the CKD EPI equation. This calculation has not been validated in all clinical situations. eGFR's persistently <60 mL/min signify possible Chronic Kidney Disease.    GFR calc non Af Amer  Date Value Ref Range Status   10/21/2015 >60 >60 mL/min Final   eGFR  Date Value Ref Range Status  12/06/2021 56 (L) >59 mL/min/1.73 Final         Failed - Valid encounter within last 6 months    Recent Outpatient Visits           6 months ago Hyperthyroidism   [facility] Storm Frisk, MD              Passed - Last BP in normal range    BP Readings from Last 1 Encounters:  12/06/21 112/75

## 2022-07-05 ENCOUNTER — Other Ambulatory Visit: Payer: Self-pay

## 2022-07-05 ENCOUNTER — Other Ambulatory Visit (HOSPITAL_COMMUNITY): Payer: Self-pay

## 2022-07-05 ENCOUNTER — Other Ambulatory Visit: Payer: Self-pay | Admitting: Pharmacist

## 2022-07-05 MED ORDER — AMOXICILLIN 500 MG PO CAPS
500.0000 mg | ORAL_CAPSULE | Freq: Three times a day (TID) | ORAL | 0 refills | Status: DC
  Filled 2022-07-05: qty 21, 7d supply, fill #0

## 2022-07-05 MED ORDER — FUROSEMIDE 20 MG PO TABS
20.0000 mg | ORAL_TABLET | Freq: Every day | ORAL | 0 refills | Status: DC
  Filled 2022-07-05 – 2022-07-12 (×4): qty 14, 14d supply, fill #0

## 2022-07-05 MED ORDER — METHIMAZOLE 10 MG PO TABS
20.0000 mg | ORAL_TABLET | Freq: Every day | ORAL | 0 refills | Status: DC
  Filled 2022-07-05 – 2022-07-12 (×4): qty 28, 14d supply, fill #0

## 2022-07-06 ENCOUNTER — Other Ambulatory Visit: Payer: Self-pay

## 2022-07-10 ENCOUNTER — Other Ambulatory Visit: Payer: Self-pay | Admitting: Critical Care Medicine

## 2022-07-11 ENCOUNTER — Other Ambulatory Visit: Payer: Self-pay

## 2022-07-12 ENCOUNTER — Other Ambulatory Visit: Payer: Self-pay

## 2022-07-12 ENCOUNTER — Other Ambulatory Visit (HOSPITAL_COMMUNITY): Payer: Self-pay
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
## 2022-07-18 ENCOUNTER — Ambulatory Visit: Payer: 59 | Admitting: Critical Care Medicine

## 2022-07-19 ENCOUNTER — Other Ambulatory Visit: Payer: Self-pay

## 2022-07-19 ENCOUNTER — Other Ambulatory Visit (HOSPITAL_COMMUNITY): Payer: Self-pay

## 2022-08-04 ENCOUNTER — Other Ambulatory Visit: Payer: Self-pay | Admitting: Critical Care Medicine

## 2022-08-04 ENCOUNTER — Other Ambulatory Visit: Payer: Self-pay

## 2022-08-04 MED ORDER — SPIRONOLACTONE 25 MG PO TABS
12.5000 mg | ORAL_TABLET | Freq: Every day | ORAL | 2 refills | Status: DC
  Filled 2022-08-04 – 2022-10-23 (×3): qty 45, 90d supply, fill #0

## 2022-08-05 ENCOUNTER — Other Ambulatory Visit (HOSPITAL_COMMUNITY): Payer: Self-pay

## 2022-08-07 ENCOUNTER — Other Ambulatory Visit (HOSPITAL_COMMUNITY): Payer: Self-pay

## 2022-08-07 ENCOUNTER — Other Ambulatory Visit: Payer: Self-pay | Admitting: Critical Care Medicine

## 2022-08-07 NOTE — Telephone Encounter (Signed)
Medication Refill - Medication: methimazole (TAPAZOLE) 10 MG tablet [[id number]] , furosemide (LASIX) 20 MG tablet [[id number]]  Has the patient contacted their pharmacy? Yes.    (Agent: If no, request that the patient contact the pharmacy for the refill. If patient does not wish to contact the pharmacy document the reason why and proceed with request.)  (Agent: If yes, when and what did the pharmacy advise?) Contact PCP   Preferred Pharmacy (with phone number or street name): [facility]   Has the patient been seen for an appointment in the last year OR does the patient have an upcoming appointment? Yes.    Agent: Please be advised that RX refills may take up to 3 business days. We ask that you follow-up with your pharmacy.   Pt is requesting enough pills to the last until their appointment on 10/31/22.

## 2022-08-08 MED ORDER — METHIMAZOLE 10 MG PO TABS
20.0000 mg | ORAL_TABLET | Freq: Every day | ORAL | 0 refills | Status: DC
  Filled 2022-08-08 – 2022-09-05 (×2): qty 28, 14d supply, fill #0

## 2022-08-08 MED ORDER — FUROSEMIDE 20 MG PO TABS
20.0000 mg | ORAL_TABLET | Freq: Every day | ORAL | 0 refills | Status: DC
  Filled 2022-08-08 – 2022-09-05 (×2): qty 14, 14d supply, fill #0

## 2022-08-08 NOTE — Telephone Encounter (Signed)
Requested medication (s) are due for refill today: yes  Requested medication (s) are on the active medication list: yes  Last refill:  07/05/22  Future visit scheduled: yes  Notes to clinic:  Unable to refill per protocol, courtesy refill already given, routing for provider approval. Can not delegate methimazole.     Requested Prescriptions  Pending Prescriptions Disp Refills   methimazole (TAPAZOLE) 10 MG tablet 28 tablet 0    Sig: Take 2 tablets (20 mg total) by mouth daily. Please make appt.     Not Delegated - Endocrinology:  Hyperthyroid Agents Failed - 08/07/2022  1:34 PM      Failed - This refill cannot be delegated      Failed - TSH in normal range and within 180 days    TSH  Date Value Ref Range Status  12/06/2021 0.078 (L) 0.450 - 4.500 uIU/mL Final         Failed - T3 Total in normal range and within 180 days    No results found for: "T3TOTAL", "T3FREE"       Failed - T4 free in normal range and within 180 days    T4, Total  Date Value Ref Range Status  12/06/2021 5.2 4.5 - 12.0 ug/dL Final   Free T4  Date Value Ref Range Status  08/12/2007 1.02 0.89 - 1.80 ng/dL Final         Failed - Valid encounter within last 6 months    Recent Outpatient Visits           8 months ago Hyperthyroidism   [facility] Storm Frisk, MD       Future Appointments             In 2 months Storm Frisk, MD [facility]             furosemide (LASIX) 20 MG tablet 14 tablet 0    Sig: Take 1 tablet (20 mg total) by mouth daily. Must have office visit for refills     Cardiovascular:  Diuretics - Loop Failed - 08/07/2022  1:34 PM      Failed - K in normal range and within 180 days    Potassium  Date Value Ref Range Status  12/06/2021 4.2 3.5 - 5.2 mmol/L Final         Failed - Ca in normal range and within 180 days    Calcium  Date Value Ref Range Status  12/06/2021 9.5 8.7 - 10.2 mg/dL  Final         Failed - Na in normal range and within 180 days    Sodium  Date Value Ref Range Status  12/06/2021 137 134 - 144 mmol/L Final         Failed - Cr in normal range and within 180 days    Creatinine, Ser  Date Value Ref Range Status  12/06/2021 1.24 (H) 0.57 - 1.00 mg/dL Final         Failed - Cl in normal range and within 180 days    Chloride  Date Value Ref Range Status  12/06/2021 98 96 - 106 mmol/L Final         Failed - Mg Level in normal range and within 180 days    Magnesium  Date Value Ref Range Status  10/21/2015 2.1 1.7 - 2.4 mg/dL Final         Failed - Valid encounter  within last 6 months    Recent Outpatient Visits           8 months ago Hyperthyroidism   [facility] Storm Frisk, MD       Future Appointments             In 2 months Storm Frisk, MD [facility]            Passed - Last BP in normal range    BP Readings from Last 1 Encounters:  12/06/21 112/75

## 2022-08-09 ENCOUNTER — Other Ambulatory Visit: Payer: Self-pay

## 2022-08-09 ENCOUNTER — Other Ambulatory Visit (HOSPITAL_COMMUNITY): Payer: Self-pay

## 2022-08-16 ENCOUNTER — Other Ambulatory Visit (HOSPITAL_COMMUNITY): Payer: Self-pay

## 2022-08-18 ENCOUNTER — Other Ambulatory Visit (HOSPITAL_COMMUNITY): Payer: Self-pay

## 2022-08-28 ENCOUNTER — Other Ambulatory Visit (HOSPITAL_COMMUNITY): Payer: Self-pay

## 2022-09-01 ENCOUNTER — Other Ambulatory Visit (HOSPITAL_COMMUNITY): Payer: Self-pay

## 2022-09-01 ENCOUNTER — Telehealth: Payer: Self-pay | Admitting: Critical Care Medicine

## 2022-09-01 ENCOUNTER — Other Ambulatory Visit: Payer: Self-pay

## 2022-09-01 NOTE — Telephone Encounter (Signed)
Pt called in says doesn't have a ride and can't afford the meds, traZODone (DESYREL) 50 MG tablet  /furosemide (LASIX) 20 MG table/  and  trazadone 50mg .Please call back with any suggestions

## 2022-09-04 ENCOUNTER — Other Ambulatory Visit (HOSPITAL_COMMUNITY): Payer: Self-pay

## 2022-09-04 ENCOUNTER — Other Ambulatory Visit: Payer: Self-pay

## 2022-09-05 ENCOUNTER — Other Ambulatory Visit: Payer: Self-pay

## 2022-09-05 ENCOUNTER — Other Ambulatory Visit: Payer: Self-pay | Admitting: Critical Care Medicine

## 2022-09-05 ENCOUNTER — Other Ambulatory Visit (HOSPITAL_COMMUNITY): Payer: Self-pay

## 2022-09-05 MED ORDER — PROPRANOLOL HCL 10 MG PO TABS
10.0000 mg | ORAL_TABLET | Freq: Three times a day (TID) | ORAL | 1 refills | Status: DC
  Filled 2022-09-05: qty 90, 30d supply, fill #0
  Filled 2022-09-14: qty 90, 30d supply, fill #1

## 2022-09-06 ENCOUNTER — Other Ambulatory Visit: Payer: Self-pay

## 2022-09-14 ENCOUNTER — Other Ambulatory Visit: Payer: Self-pay | Admitting: Critical Care Medicine

## 2022-09-14 ENCOUNTER — Other Ambulatory Visit (HOSPITAL_COMMUNITY): Payer: Self-pay

## 2022-09-15 ENCOUNTER — Other Ambulatory Visit (HOSPITAL_COMMUNITY): Payer: Self-pay

## 2022-09-15 ENCOUNTER — Other Ambulatory Visit: Payer: Self-pay

## 2022-09-15 MED ORDER — FUROSEMIDE 20 MG PO TABS
20.0000 mg | ORAL_TABLET | Freq: Every day | ORAL | 0 refills | Status: DC
  Filled 2022-09-15: qty 30, 30d supply, fill #0

## 2022-09-15 MED ORDER — METHIMAZOLE 10 MG PO TABS
20.0000 mg | ORAL_TABLET | Freq: Every day | ORAL | 0 refills | Status: DC
  Filled 2022-09-15: qty 60, 30d supply, fill #0

## 2022-09-16 ENCOUNTER — Other Ambulatory Visit (HOSPITAL_COMMUNITY): Payer: Self-pay

## 2022-09-18 ENCOUNTER — Other Ambulatory Visit: Payer: Self-pay

## 2022-10-07 ENCOUNTER — Other Ambulatory Visit (HOSPITAL_COMMUNITY): Payer: Self-pay

## 2022-10-09 ENCOUNTER — Other Ambulatory Visit (HOSPITAL_COMMUNITY): Payer: Self-pay

## 2022-10-09 ENCOUNTER — Other Ambulatory Visit: Payer: Self-pay

## 2022-10-09 MED ORDER — PENICILLIN V POTASSIUM 500 MG PO TABS
250.0000 mg | ORAL_TABLET | Freq: Two times a day (BID) | ORAL | 6 refills | Status: DC
  Filled 2022-10-09: qty 30, 30d supply, fill #0

## 2022-10-21 ENCOUNTER — Other Ambulatory Visit: Payer: Self-pay | Admitting: Critical Care Medicine

## 2022-10-23 ENCOUNTER — Other Ambulatory Visit (HOSPITAL_COMMUNITY): Payer: Self-pay

## 2022-10-23 ENCOUNTER — Other Ambulatory Visit: Payer: Self-pay

## 2022-10-24 ENCOUNTER — Other Ambulatory Visit: Payer: Self-pay

## 2022-10-24 ENCOUNTER — Other Ambulatory Visit (HOSPITAL_COMMUNITY): Payer: Self-pay

## 2022-10-24 MED ORDER — PANTOPRAZOLE SODIUM 40 MG PO TBEC
40.0000 mg | DELAYED_RELEASE_TABLET | Freq: Every day | ORAL | 0 refills | Status: DC
  Filled 2022-10-24: qty 90, 90d supply, fill #0

## 2022-10-24 MED ORDER — FUROSEMIDE 20 MG PO TABS
20.0000 mg | ORAL_TABLET | Freq: Every day | ORAL | 0 refills | Status: DC
  Filled 2022-10-24: qty 90, 90d supply, fill #0

## 2022-10-24 MED ORDER — METHIMAZOLE 10 MG PO TABS
20.0000 mg | ORAL_TABLET | Freq: Every day | ORAL | 0 refills | Status: DC
  Filled 2022-10-24: qty 60, 30d supply, fill #0

## 2022-10-24 MED ORDER — TRAZODONE HCL 50 MG PO TABS
25.0000 mg | ORAL_TABLET | Freq: Every day | ORAL | 0 refills | Status: DC
  Filled 2022-10-24: qty 90, 180d supply, fill #0

## 2022-10-24 NOTE — Telephone Encounter (Signed)
OV scheduled 10/31/22.  Requested Prescriptions  Pending Prescriptions Disp Refills   methimazole (TAPAZOLE) 10 MG tablet 60 tablet 0    Sig: Take 2 tablets (20 mg) by mouth daily. Please make appt.     Not Delegated - Endocrinology:  Hyperthyroid Agents Failed - 10/21/2022  5:47 PM      Failed - This refill cannot be delegated      Failed - TSH in normal range and within 180 days    TSH  Date Value Ref Range Status  12/06/2021 0.078 (L) 0.450 - 4.500 uIU/mL Final         Failed - T3 Total in normal range and within 180 days    No results found for: "T3TOTAL", "T3FREE"       Failed - T4 free in normal range and within 180 days    T4, Total  Date Value Ref Range Status  12/06/2021 5.2 4.5 - 12.0 ug/dL Final   Free T4  Date Value Ref Range Status  08/12/2007 1.02 0.89 - 1.80 ng/dL Final         Failed - Valid encounter within last 6 months    Recent Outpatient Visits           10 months ago Hyperthyroidism   [facility] Storm Frisk, MD       Future Appointments             In 1 week Storm Frisk, MD [facility]             furosemide (LASIX) 20 MG tablet 90 tablet 0    Sig: Take 1 tablet (20 mg) by mouth daily. Must have office visit for refills     Cardiovascular:  Diuretics - Loop Failed - 10/21/2022  5:47 PM      Failed - K in normal range and within 180 days    Potassium  Date Value Ref Range Status  12/06/2021 4.2 3.5 - 5.2 mmol/L Final         Failed - Ca in normal range and within 180 days    Calcium  Date Value Ref Range Status  12/06/2021 9.5 8.7 - 10.2 mg/dL Final         Failed - Na in normal range and within 180 days    Sodium  Date Value Ref Range Status  12/06/2021 137 134 - 144 mmol/L Final         Failed - Cr in normal range and within 180 days    Creatinine, Ser  Date Value Ref Range Status  12/06/2021 1.24 (H) 0.57 - 1.00 mg/dL Final          Failed - Cl in normal range and within 180 days    Chloride  Date Value Ref Range Status  12/06/2021 98 96 - 106 mmol/L Final         Failed - Mg Level in normal range and within 180 days    Magnesium  Date Value Ref Range Status  10/21/2015 2.1 1.7 - 2.4 mg/dL Final         Failed - Valid encounter within last 6 months    Recent Outpatient Visits           10 months ago Hyperthyroidism   [facility] Storm Frisk, MD       Future Appointments  In 1 week Storm Frisk, MD [facility]            Passed - Last BP in normal range    BP Readings from Last 1 Encounters:  12/06/21 112/75          pantoprazole (PROTONIX) 40 MG tablet 90 tablet 0    Sig: Take 1 tablet (40 mg total) by mouth daily.     Gastroenterology: Proton Pump Inhibitors Passed - 10/21/2022  5:47 PM      Passed - Valid encounter within last 12 months    Recent Outpatient Visits           10 months ago Hyperthyroidism   [facility] Storm Frisk, MD       Future Appointments             In 1 week Storm Frisk, MD [facility]             traZODone (DESYREL) 50 MG tablet 90 tablet 0    Sig: Take 0.5 tablets (25 mg total) by mouth at bedtime.     Psychiatry: Antidepressants - Serotonin Modulator Failed - 10/21/2022  5:47 PM      Failed - Valid encounter within last 6 months    Recent Outpatient Visits           10 months ago Hyperthyroidism   [facility] Storm Frisk, MD       Future Appointments             In 1 week Delford Field Charlcie Cradle, MD [facility]

## 2022-10-24 NOTE — Telephone Encounter (Signed)
Requested medication (s) are due for refill today: yes  Requested medication (s) are on the active medication list: yes  Last refill:  09/15/22  Future visit scheduled: yes  Notes to clinic:  Unable to refill per protocol, cannot delegate.      Requested Prescriptions  Pending Prescriptions Disp Refills   methimazole (TAPAZOLE) 10 MG tablet 60 tablet 0    Sig: Take 2 tablets (20 mg) by mouth daily. Please make appt.     Not Delegated - Endocrinology:  Hyperthyroid Agents Failed - 10/21/2022  5:47 PM      Failed - This refill cannot be delegated      Failed - TSH in normal range and within 180 days    TSH  Date Value Ref Range Status  12/06/2021 0.078 (L) 0.450 - 4.500 uIU/mL Final         Failed - T3 Total in normal range and within 180 days    No results found for: "T3TOTAL", "T3FREE"       Failed - T4 free in normal range and within 180 days    T4, Total  Date Value Ref Range Status  12/06/2021 5.2 4.5 - 12.0 ug/dL Final   Free T4  Date Value Ref Range Status  08/12/2007 1.02 0.89 - 1.80 ng/dL Final         Failed - Valid encounter within last 6 months    Recent Outpatient Visits           10 months ago Hyperthyroidism   [facility] Storm Frisk, MD       Future Appointments             In 1 week Storm Frisk, MD [facility]            Signed Prescriptions Disp Refills   furosemide (LASIX) 20 MG tablet 90 tablet 0    Sig: Take 1 tablet (20 mg) by mouth daily. Must have office visit for refills     Cardiovascular:  Diuretics - Loop Failed - 10/21/2022  5:47 PM      Failed - K in normal range and within 180 days    Potassium  Date Value Ref Range Status  12/06/2021 4.2 3.5 - 5.2 mmol/L Final         Failed - Ca in normal range and within 180 days    Calcium  Date Value Ref Range Status  12/06/2021 9.5 8.7 - 10.2 mg/dL Final         Failed - Na in normal range and  within 180 days    Sodium  Date Value Ref Range Status  12/06/2021 137 134 - 144 mmol/L Final         Failed - Cr in normal range and within 180 days    Creatinine, Ser  Date Value Ref Range Status  12/06/2021 1.24 (H) 0.57 - 1.00 mg/dL Final         Failed - Cl in normal range and within 180 days    Chloride  Date Value Ref Range Status  12/06/2021 98 96 - 106 mmol/L Final         Failed - Mg Level in normal range and within 180 days    Magnesium  Date Value Ref Range Status  10/21/2015 2.1 1.7 - 2.4 mg/dL Final         Failed - Valid encounter within last 6 months  Recent Outpatient Visits           10 months ago Hyperthyroidism   [facility] Storm Frisk, MD       Future Appointments             In 1 week Storm Frisk, MD [facility]            Passed - Last BP in normal range    BP Readings from Last 1 Encounters:  12/06/21 112/75          pantoprazole (PROTONIX) 40 MG tablet 90 tablet 0    Sig: Take 1 tablet (40 mg total) by mouth daily.     Gastroenterology: Proton Pump Inhibitors Passed - 10/21/2022  5:47 PM      Passed - Valid encounter within last 12 months    Recent Outpatient Visits           10 months ago Hyperthyroidism   [facility] Storm Frisk, MD       Future Appointments             In 1 week Storm Frisk, MD [facility]             traZODone (DESYREL) 50 MG tablet 90 tablet 0    Sig: Take 0.5 tablets (25 mg total) by mouth at bedtime.     Psychiatry: Antidepressants - Serotonin Modulator Failed - 10/21/2022  5:47 PM      Failed - Valid encounter within last 6 months    Recent Outpatient Visits           10 months ago Hyperthyroidism   [facility] Storm Frisk, MD       Future Appointments             In 1 week  Delford Field Charlcie Cradle, MD [facility]

## 2022-10-31 ENCOUNTER — Other Ambulatory Visit (HOSPITAL_COMMUNITY): Payer: Self-pay

## 2022-10-31 ENCOUNTER — Other Ambulatory Visit: Payer: Self-pay

## 2022-10-31 ENCOUNTER — Encounter: Payer: Self-pay | Admitting: Critical Care Medicine

## 2022-10-31 ENCOUNTER — Ambulatory Visit: Payer: 59 | Attending: Critical Care Medicine | Admitting: Critical Care Medicine

## 2022-10-31 VITALS — BP 109/74 | HR 70 | Wt 123.8 lb

## 2022-10-31 DIAGNOSIS — F23 Brief psychotic disorder: Secondary | ICD-10-CM | POA: Diagnosis not present

## 2022-10-31 DIAGNOSIS — I5022 Chronic systolic (congestive) heart failure: Secondary | ICD-10-CM

## 2022-10-31 DIAGNOSIS — J851 Abscess of lung with pneumonia: Secondary | ICD-10-CM

## 2022-10-31 DIAGNOSIS — E876 Hypokalemia: Secondary | ICD-10-CM | POA: Diagnosis not present

## 2022-10-31 DIAGNOSIS — I480 Paroxysmal atrial fibrillation: Secondary | ICD-10-CM | POA: Diagnosis not present

## 2022-10-31 DIAGNOSIS — Z23 Encounter for immunization: Secondary | ICD-10-CM

## 2022-10-31 DIAGNOSIS — E782 Mixed hyperlipidemia: Secondary | ICD-10-CM

## 2022-10-31 DIAGNOSIS — E0590 Thyrotoxicosis, unspecified without thyrotoxic crisis or storm: Secondary | ICD-10-CM | POA: Diagnosis not present

## 2022-10-31 MED ORDER — PROPRANOLOL HCL 10 MG PO TABS
10.0000 mg | ORAL_TABLET | Freq: Three times a day (TID) | ORAL | 1 refills | Status: DC
  Filled 2022-10-31: qty 90, 30d supply, fill #0
  Filled 2022-12-10: qty 90, 30d supply, fill #1

## 2022-10-31 MED ORDER — PENICILLIN V POTASSIUM 500 MG PO TABS
250.0000 mg | ORAL_TABLET | Freq: Two times a day (BID) | ORAL | 6 refills | Status: DC
  Filled 2022-10-31 – 2022-11-01 (×2): qty 30, 30d supply, fill #0
  Filled 2022-12-10: qty 30, 30d supply, fill #1
  Filled 2023-01-12: qty 30, 30d supply, fill #2
  Filled 2023-02-03 – 2023-02-08 (×2): qty 30, 30d supply, fill #3
  Filled 2023-02-17 – 2023-03-05 (×3): qty 30, 30d supply, fill #4

## 2022-10-31 MED ORDER — TRAZODONE HCL 50 MG PO TABS
25.0000 mg | ORAL_TABLET | Freq: Every day | ORAL | 0 refills | Status: DC
  Filled 2022-10-31: qty 90, 180d supply, fill #0
  Filled 2022-11-01: qty 45, 90d supply, fill #0
  Filled 2022-12-10 – 2023-01-12 (×2): qty 45, 90d supply, fill #1
  Filled 2023-01-23: qty 15, 30d supply, fill #1
  Filled 2023-02-17: qty 15, 30d supply, fill #2
  Filled 2023-03-04: qty 15, 30d supply, fill #3

## 2022-10-31 MED ORDER — PANTOPRAZOLE SODIUM 40 MG PO TBEC
40.0000 mg | DELAYED_RELEASE_TABLET | Freq: Every day | ORAL | 0 refills | Status: DC
  Filled 2022-10-31 – 2022-11-01 (×2): qty 90, 90d supply, fill #0

## 2022-10-31 MED ORDER — SPIRONOLACTONE 25 MG PO TABS
12.5000 mg | ORAL_TABLET | Freq: Every day | ORAL | 2 refills | Status: DC
  Filled 2022-10-31 – 2023-01-01 (×3): qty 45, 90d supply, fill #0
  Filled 2023-02-17 – 2023-03-04 (×2): qty 45, 90d supply, fill #1

## 2022-10-31 MED ORDER — ROSUVASTATIN CALCIUM 20 MG PO TABS
20.0000 mg | ORAL_TABLET | Freq: Every day | ORAL | 3 refills | Status: DC
  Filled 2022-10-31: qty 90, 90d supply, fill #0
  Filled 2022-12-10 – 2023-01-12 (×5): qty 90, 90d supply, fill #1
  Filled 2023-02-17 – 2023-03-04 (×2): qty 90, 90d supply, fill #2

## 2022-10-31 MED ORDER — METHIMAZOLE 10 MG PO TABS
20.0000 mg | ORAL_TABLET | Freq: Every day | ORAL | 0 refills | Status: DC
  Filled 2022-10-31 – 2022-11-21 (×2): qty 60, 30d supply, fill #0

## 2022-10-31 MED ORDER — FUROSEMIDE 20 MG PO TABS
20.0000 mg | ORAL_TABLET | Freq: Every day | ORAL | 0 refills | Status: DC
  Filled 2022-10-31 – 2023-01-12 (×5): qty 90, 90d supply, fill #0

## 2022-10-31 MED ORDER — TRETINOIN 0.1 % EX CREA
1.0000 "application " | TOPICAL_CREAM | Freq: Every day | CUTANEOUS | 4 refills | Status: DC
  Filled 2022-10-31 – 2023-02-08 (×3): qty 45, 30d supply, fill #0

## 2022-10-31 MED ORDER — EMPAGLIFLOZIN 10 MG PO TABS
10.0000 mg | ORAL_TABLET | Freq: Every day | ORAL | 3 refills | Status: DC
Start: 2022-10-31 — End: 2023-03-12
  Filled 2022-10-31: qty 30, 30d supply, fill #0

## 2022-10-31 NOTE — Assessment & Plan Note (Signed)
Compensated at this time continue furosemide and Jardiance
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
## 2022-10-31 NOTE — Assessment & Plan Note (Signed)
Reassess potassium 

## 2022-10-31 NOTE — Patient Instructions (Signed)
Labs today Pap smear will be scheduled All medications refilled You signed record release for your records in Jan 2023 at [facility] Return 4 months

## 2022-10-31 NOTE — Assessment & Plan Note (Signed)
Continue statin. 

## 2022-10-31 NOTE — Assessment & Plan Note (Signed)
Continue with propranolol ?

## 2022-10-31 NOTE — Assessment & Plan Note (Signed)
Check thyroid function continue propranolol and methimazole

## 2022-10-31 NOTE — Assessment & Plan Note (Signed)
Continue penicillin recheck chest x-ray get records from [facility]

## 2022-11-01 ENCOUNTER — Telehealth: Payer: Self-pay

## 2022-11-01 ENCOUNTER — Other Ambulatory Visit: Payer: Self-pay

## 2022-11-01 ENCOUNTER — Other Ambulatory Visit (HOSPITAL_COMMUNITY): Payer: Self-pay

## 2022-11-01 LAB — LIPID PANEL
Chol/HDL Ratio: 5.3 ratio — ABNORMAL HIGH (ref 0.0–4.4)
Cholesterol, Total: 276 mg/dL — ABNORMAL HIGH (ref 100–199)
HDL: 52 mg/dL (ref >39)
LDL Chol Calc (NIH): 199 mg/dL — ABNORMAL HIGH (ref 0–99)
Triglycerides: 135 mg/dL (ref 0–149)
VLDL Cholesterol Cal: 25 mg/dL (ref 5–40)

## 2022-11-01 LAB — COMPREHENSIVE METABOLIC PANEL
ALT: 8 IU/L (ref 0–32)
AST: 13 IU/L (ref 0–40)
Albumin: 4.9 g/dL (ref 3.9–4.9)
Alkaline Phosphatase: 137 IU/L — ABNORMAL HIGH (ref 44–121)
BUN/Creatinine Ratio: 10 (ref 9–23)
BUN: 14 mg/dL (ref 6–24)
Bilirubin Total: 1.2 mg/dL (ref 0.0–1.2)
CO2: 24 mmol/L (ref 20–29)
Calcium: 9.6 mg/dL (ref 8.7–10.2)
Chloride: 98 mmol/L (ref 96–106)
Creatinine, Ser: 1.39 mg/dL — ABNORMAL HIGH (ref 0.57–1.00)
Globulin, Total: 3 g/dL (ref 1.5–4.5)
Glucose: 102 mg/dL — ABNORMAL HIGH (ref 70–99)
Potassium: 4 mmol/L (ref 3.5–5.2)
Sodium: 138 mmol/L (ref 134–144)
Total Protein: 7.9 g/dL (ref 6.0–8.5)
eGFR: 49 mL/min/{1.73_m2} — ABNORMAL LOW (ref >59)

## 2022-11-01 LAB — CBC WITH DIFFERENTIAL/PLATELET
Basophils Absolute: 0.1 10*3/uL (ref 0.0–0.2)
Basos: 1 %
EOS (ABSOLUTE): 0.1 10*3/uL (ref 0.0–0.4)
Eos: 2 %
Hematocrit: 45.7 % (ref 34.0–46.6)
Hemoglobin: 15.3 g/dL (ref 11.1–15.9)
Immature Grans (Abs): 0 10*3/uL (ref 0.0–0.1)
Immature Granulocytes: 0 %
Lymphocytes Absolute: 1.3 10*3/uL (ref 0.7–3.1)
Lymphs: 17 %
MCH: 29.2 pg (ref 26.6–33.0)
MCHC: 33.5 g/dL (ref 31.5–35.7)
MCV: 87 fL (ref 79–97)
Monocytes Absolute: 0.8 10*3/uL (ref 0.1–0.9)
Monocytes: 10 %
Neutrophils Absolute: 5.3 10*3/uL (ref 1.4–7.0)
Neutrophils: 70 %
Platelets: 333 10*3/uL (ref 150–450)
RBC: 5.24 x10E6/uL (ref 3.77–5.28)
RDW: 12.6 % (ref 11.7–15.4)
WBC: 7.6 10*3/uL (ref 3.4–10.8)

## 2022-11-01 NOTE — Progress Notes (Signed)
Let patient know her potassium is normal kidneys are stable liver normal cholesterol remains extremely high blood counts are normal She needs to make sure she is taking her rosuvastatin which is the cholesterol pill

## 2022-11-01 NOTE — Telephone Encounter (Signed)
Called patient unable to make contact or leave voicemail, information sent to the nurse pool

## 2022-11-01 NOTE — Telephone Encounter (Signed)
-----   Message from Shan Levans sent at 11/01/2022  2:11 PM EDT ----- Let patient know her potassium is normal kidneys are stable liver normal cholesterol remains extremely high blood counts are normal She needs to make sure she is taking her rosuvastatin which is the cholesterol pill

## 2022-11-21 ENCOUNTER — Other Ambulatory Visit (HOSPITAL_COMMUNITY): Payer: Self-pay

## 2022-11-22 ENCOUNTER — Other Ambulatory Visit: Payer: Self-pay

## 2022-11-24 ENCOUNTER — Other Ambulatory Visit: Payer: Self-pay | Admitting: Critical Care Medicine

## 2022-11-25 ENCOUNTER — Other Ambulatory Visit (HOSPITAL_COMMUNITY): Payer: Self-pay

## 2022-11-27 ENCOUNTER — Other Ambulatory Visit (HOSPITAL_COMMUNITY): Payer: Self-pay

## 2022-11-27 MED ORDER — METHIMAZOLE 10 MG PO TABS
20.0000 mg | ORAL_TABLET | Freq: Every day | ORAL | 1 refills | Status: DC
Start: 2022-11-27 — End: 2023-03-12
  Filled 2022-11-27 – 2022-12-10 (×2): qty 180, 90d supply, fill #0
  Filled 2023-02-17: qty 180, 90d supply, fill #1
  Filled 2023-03-04 – 2023-03-05 (×2): qty 60, 30d supply, fill #1

## 2022-12-10 ENCOUNTER — Other Ambulatory Visit: Payer: Self-pay | Admitting: Critical Care Medicine

## 2022-12-10 ENCOUNTER — Other Ambulatory Visit (HOSPITAL_COMMUNITY): Payer: Self-pay

## 2022-12-11 ENCOUNTER — Other Ambulatory Visit: Payer: Self-pay

## 2022-12-11 ENCOUNTER — Other Ambulatory Visit (HOSPITAL_COMMUNITY): Payer: Self-pay

## 2022-12-11 MED ORDER — PANTOPRAZOLE SODIUM 40 MG PO TBEC
40.0000 mg | DELAYED_RELEASE_TABLET | Freq: Every day | ORAL | 0 refills | Status: DC
  Filled 2022-12-11 – 2023-01-12 (×5): qty 90, 90d supply, fill #0
  Filled 2023-01-23: qty 30, 30d supply, fill #0
  Filled 2023-02-17: qty 30, 30d supply, fill #1
  Filled 2023-03-04: qty 30, 30d supply, fill #2

## 2022-12-11 NOTE — Telephone Encounter (Signed)
Requested Prescriptions  Pending Prescriptions Disp Refills   pantoprazole (PROTONIX) 40 MG tablet 90 tablet 0    Sig: Take 1 tablet (40 mg total) by mouth daily.     Gastroenterology: Proton Pump Inhibitors Passed - 12/10/2022 12:15 PM      Passed - Valid encounter within last 12 months    Recent Outpatient Visits           1 month ago Abscess of lung with pneumonia, unspecified laterality [facility])   [facility] Storm Frisk, MD   1 year ago Hyperthyroidism   [facility] Storm Frisk, MD

## 2022-12-12 ENCOUNTER — Other Ambulatory Visit (HOSPITAL_COMMUNITY): Payer: Self-pay

## 2023-01-01 ENCOUNTER — Other Ambulatory Visit: Payer: Self-pay

## 2023-01-01 ENCOUNTER — Other Ambulatory Visit (HOSPITAL_COMMUNITY): Payer: Self-pay

## 2023-01-02 ENCOUNTER — Other Ambulatory Visit: Payer: Self-pay

## 2023-01-03 ENCOUNTER — Other Ambulatory Visit (HOSPITAL_COMMUNITY): Payer: Self-pay

## 2023-01-03 ENCOUNTER — Other Ambulatory Visit: Payer: Self-pay | Admitting: Critical Care Medicine

## 2023-01-03 MED ORDER — PROPRANOLOL HCL 10 MG PO TABS
10.0000 mg | ORAL_TABLET | Freq: Three times a day (TID) | ORAL | 1 refills | Status: DC
  Filled 2023-01-03: qty 270, 90d supply, fill #0
  Filled 2023-03-04: qty 270, 90d supply, fill #1

## 2023-01-08 ENCOUNTER — Other Ambulatory Visit: Payer: Self-pay

## 2023-01-11 ENCOUNTER — Other Ambulatory Visit (HOSPITAL_COMMUNITY): Payer: Self-pay

## 2023-01-11 ENCOUNTER — Other Ambulatory Visit: Payer: Self-pay

## 2023-01-12 ENCOUNTER — Other Ambulatory Visit (HOSPITAL_COMMUNITY): Payer: Self-pay

## 2023-01-12 ENCOUNTER — Other Ambulatory Visit: Payer: Self-pay

## 2023-01-23 ENCOUNTER — Other Ambulatory Visit: Payer: Self-pay

## 2023-01-23 ENCOUNTER — Other Ambulatory Visit (HOSPITAL_COMMUNITY): Payer: Self-pay

## 2023-01-24 ENCOUNTER — Other Ambulatory Visit (HOSPITAL_COMMUNITY): Payer: Self-pay

## 2023-01-24 DIAGNOSIS — Z803 Family history of malignant neoplasm of breast: Secondary | ICD-10-CM | POA: Diagnosis not present

## 2023-01-24 DIAGNOSIS — E785 Hyperlipidemia, unspecified: Secondary | ICD-10-CM | POA: Diagnosis not present

## 2023-01-24 DIAGNOSIS — Z8249 Family history of ischemic heart disease and other diseases of the circulatory system: Secondary | ICD-10-CM | POA: Diagnosis not present

## 2023-01-24 DIAGNOSIS — Z7984 Long term (current) use of oral hypoglycemic drugs: Secondary | ICD-10-CM | POA: Diagnosis not present

## 2023-01-24 DIAGNOSIS — I509 Heart failure, unspecified: Secondary | ICD-10-CM | POA: Diagnosis not present

## 2023-01-24 DIAGNOSIS — Z823 Family history of stroke: Secondary | ICD-10-CM | POA: Diagnosis not present

## 2023-01-24 DIAGNOSIS — K219 Gastro-esophageal reflux disease without esophagitis: Secondary | ICD-10-CM | POA: Diagnosis not present

## 2023-01-24 DIAGNOSIS — I4891 Unspecified atrial fibrillation: Secondary | ICD-10-CM | POA: Diagnosis not present

## 2023-01-24 DIAGNOSIS — Z720 Tobacco use: Secondary | ICD-10-CM | POA: Diagnosis not present

## 2023-01-24 DIAGNOSIS — I110 Hypertensive heart disease with heart failure: Secondary | ICD-10-CM | POA: Diagnosis not present

## 2023-01-24 DIAGNOSIS — Z818 Family history of other mental and behavioral disorders: Secondary | ICD-10-CM | POA: Diagnosis not present

## 2023-01-24 DIAGNOSIS — D6869 Other thrombophilia: Secondary | ICD-10-CM | POA: Diagnosis not present

## 2023-02-03 ENCOUNTER — Other Ambulatory Visit (HOSPITAL_COMMUNITY): Payer: Self-pay

## 2023-02-08 ENCOUNTER — Other Ambulatory Visit: Payer: Self-pay | Admitting: Critical Care Medicine

## 2023-02-08 ENCOUNTER — Other Ambulatory Visit (HOSPITAL_COMMUNITY): Payer: Self-pay

## 2023-02-08 ENCOUNTER — Other Ambulatory Visit: Payer: Self-pay

## 2023-02-08 MED ORDER — TRETINOIN 0.1 % EX CREA
1.0000 | TOPICAL_CREAM | Freq: Every day | CUTANEOUS | 4 refills | Status: DC
  Filled 2023-02-08: qty 45, 30d supply, fill #0

## 2023-02-09 ENCOUNTER — Other Ambulatory Visit: Payer: Self-pay

## 2023-02-17 ENCOUNTER — Other Ambulatory Visit: Payer: Self-pay | Admitting: Critical Care Medicine

## 2023-02-19 ENCOUNTER — Other Ambulatory Visit (HOSPITAL_COMMUNITY): Payer: Self-pay

## 2023-02-19 ENCOUNTER — Other Ambulatory Visit: Payer: Self-pay

## 2023-02-19 MED ORDER — FUROSEMIDE 20 MG PO TABS
20.0000 mg | ORAL_TABLET | Freq: Every day | ORAL | 0 refills | Status: DC
  Filled 2023-02-19 – 2023-03-04 (×2): qty 30, 30d supply, fill #0

## 2023-03-01 ENCOUNTER — Other Ambulatory Visit: Payer: Self-pay

## 2023-03-04 ENCOUNTER — Other Ambulatory Visit (HOSPITAL_COMMUNITY): Payer: Self-pay

## 2023-03-05 ENCOUNTER — Other Ambulatory Visit: Payer: Self-pay

## 2023-03-05 ENCOUNTER — Other Ambulatory Visit (HOSPITAL_BASED_OUTPATIENT_CLINIC_OR_DEPARTMENT_OTHER): Payer: Self-pay

## 2023-03-05 ENCOUNTER — Other Ambulatory Visit (HOSPITAL_COMMUNITY): Payer: Self-pay

## 2023-03-06 ENCOUNTER — Other Ambulatory Visit: Payer: Self-pay

## 2023-03-12 ENCOUNTER — Other Ambulatory Visit: Payer: Self-pay | Admitting: Critical Care Medicine

## 2023-03-13 ENCOUNTER — Other Ambulatory Visit: Payer: Self-pay

## 2023-03-13 ENCOUNTER — Other Ambulatory Visit (HOSPITAL_COMMUNITY): Payer: Self-pay

## 2023-03-14 ENCOUNTER — Other Ambulatory Visit: Payer: Self-pay | Admitting: Critical Care Medicine

## 2023-03-16 ENCOUNTER — Other Ambulatory Visit: Payer: Self-pay | Admitting: Critical Care Medicine

## 2023-03-26 ENCOUNTER — Other Ambulatory Visit: Payer: Self-pay | Admitting: Critical Care Medicine

## 2023-03-27 ENCOUNTER — Other Ambulatory Visit (HOSPITAL_COMMUNITY): Payer: Self-pay

## 2023-03-29 ENCOUNTER — Other Ambulatory Visit: Payer: Self-pay | Admitting: Critical Care Medicine

## 2023-03-30 NOTE — Telephone Encounter (Signed)
Requested by interface surescripts. Last refill documented 03/15/23.  Requested Prescriptions  Refused Prescriptions Disp Refills   RETIN-A 0.1 % cream [Pharmacy Med Name: RETIN-A 0.1% CREAM 0.1 Cream] 20 g 10    Sig: APPLY TO FACE ONCE DAILY AT BEDTIME     Dermatology:  Acne preparations Passed - 03/30/2023  9:32 AM      Passed - Valid encounter within last 12 months    Recent Outpatient Visits           5 months ago Abscess of lung with pneumonia, unspecified laterality (HCC)   [facility] [place] Storm Frisk, MD   1 year ago Hyperthyroidism   [facility] [place] Storm Frisk, MD

## 2023-04-02 ENCOUNTER — Other Ambulatory Visit: Payer: Self-pay | Admitting: Critical Care Medicine

## 2023-04-02 NOTE — Telephone Encounter (Signed)
Copied from CRM [id number]. Topic: Clinical - Medication Refill >> Apr 02, 2023 11:57 AM Payton Doughty wrote: Most Recent Primary Care Visit:  Provider: Shan Levans E  Department: [facility]  Visit Type: OFFICE VISIT  Date: 10/31/2022  Medication: furosemide (LASIX) 20 MG tablet  empagliflozin (JARDIANCE) 10 MG TABS tablet  propranolol (INDERAL) 10 MG tablet rosuvastatin (CRESTOR) 20 MG tablet pantoprazole (PROTONIX) 40 MG tablet nicotine (NICODERM CQ - DOSED IN MG/24 HR) 7 mg/24hr patch Has the patient contacted their pharmacy? No (Agent: If no, request that the patient contact the pharmacy for the refill. If patient does not wish to contact the pharmacy document the reason why and proceed with request.) (Agent: If yes, when and what did the pharmacy advise?)[facility] calling this am for the pt  Is this the correct pharmacy for this prescription? Yes If no, delete pharmacy and type the correct one.  This is the patient's preferred pharmacy:  [id number] [place], [place] - [address] [place] [address] Phone: [phone] Fax: [phone]  [facility] - [facility] - [facility] [address] [place] [address] Phone: [phone] Fax: [phone]  [facility] APFS No address on file  [facility], [place] - [address] [place] [address] Phone: [phone] Fax: [phone]   Has the prescription been filled recently? No  Is the patient out of the medication? Yes  Has the patient been seen for an appointment in the last year OR does the patient have an upcoming appointment? Yes  Can we respond through MyChart? No  Agent: Please be advised that Rx refills may take up to 3 business days. We ask that you follow-up with your pharmacy.

## 2023-04-04 NOTE — Telephone Encounter (Signed)
Requested medication (s) are due for refill today: Yes  Requested medication (s) are on the active medication list: Yes  Last refill:  03/12/23 30 day supply given, 0 refills  Future visit scheduled: No  Notes to clinic:  Unable to refill per protocol, courtesy refill already given, routing for provider approval.      Requested Prescriptions  Pending Prescriptions Disp Refills   empagliflozin (JARDIANCE) 10 MG TABS tablet 30 tablet 0     Endocrinology:  Diabetes - SGLT2 Inhibitors Failed - 04/04/2023  2:52 PM      Failed - Cr in normal range and within 360 days    Creatinine, Ser  Date Value Ref Range Status  10/31/2022 1.39 (H) 0.57 - 1.00 mg/dL Final         Failed - HBA1C is between 0 and 7.9 and within 180 days    Hgb A1c MFr Bld  Date Value Ref Range Status  12/06/2021 5.0 4.8 - 5.6 % Final    Comment:             Prediabetes: 5.7 - 6.4          Diabetes: >6.4          Glycemic control for adults with diabetes: <7.0          Failed - eGFR in normal range and within 360 days    GFR calc Af Amer  Date Value Ref Range Status  10/21/2015 >60 >60 mL/min Final    Comment:    (NOTE) The eGFR has been calculated using the CKD EPI equation. This calculation has not been validated in all clinical situations. eGFR's persistently <60 mL/min signify possible Chronic Kidney Disease.    GFR calc non Af Amer  Date Value Ref Range Status  10/21/2015 >60 >60 mL/min Final   eGFR  Date Value Ref Range Status  10/31/2022 49 (L) >59 mL/min/1.73 Final         Passed - Valid encounter within last 6 months    Recent Outpatient Visits           5 months ago Abscess of lung with pneumonia, unspecified laterality (HCC)   [facility] [place]. [facility] Storm Frisk, MD   1 year ago Hyperthyroidism   [facility] [place] [facility] - [facility] Dept Of [place]. [facility] Storm Frisk, MD               furosemide  (LASIX) 20 MG tablet 30 tablet 0     Cardiovascular:  Diuretics - Loop Failed - 04/04/2023  2:52 PM      Failed - Cr in normal range and within 180 days    Creatinine, Ser  Date Value Ref Range Status  10/31/2022 1.39 (H) 0.57 - 1.00 mg/dL Final         Failed - Mg Level in normal range and within 180 days    Magnesium  Date Value Ref Range Status  10/21/2015 2.1 1.7 - 2.4 mg/dL Final         Passed - K in normal range and within 180 days    Potassium  Date Value Ref Range Status  10/31/2022 4.0 3.5 - 5.2 mmol/L Final         Passed - Ca in normal range and within 180 days    Calcium  Date Value Ref Range Status  10/31/2022 9.6 8.7 - 10.2 mg/dL Final  Passed - Na in normal range and within 180 days    Sodium  Date Value Ref Range Status  10/31/2022 138 134 - 144 mmol/L Final         Passed - Cl in normal range and within 180 days    Chloride  Date Value Ref Range Status  10/31/2022 98 96 - 106 mmol/L Final         Passed - Last BP in normal range    BP Readings from Last 1 Encounters:  10/31/22 109/74         Passed - Valid encounter within last 6 months    Recent Outpatient Visits           5 months ago Abscess of lung with pneumonia, unspecified laterality (HCC)   [place] [facility] [place]. [facility] Storm Frisk, MD   1 year ago Hyperthyroidism   [place] [facility] - [facility] Dept Of [place]. [facility] Storm Frisk, MD               pantoprazole (PROTONIX) 40 MG tablet 30 tablet 0     Gastroenterology: Proton Pump Inhibitors Passed - 04/04/2023  2:52 PM      Passed - Valid encounter within last 12 months    Recent Outpatient Visits           5 months ago Abscess of lung with pneumonia, unspecified laterality [facility])   [place] [facility] [place]. [facility] Storm Frisk, MD   1 year ago Hyperthyroidism   [facility] - A Dept Of [place]. [facility] Storm Frisk, MD               propranolol (INDERAL) 10 MG tablet 90 tablet 0     Cardiovascular:  Beta Blockers Passed - 04/04/2023  2:52 PM      Passed - Last BP in normal range    BP Readings from Last 1 Encounters:  10/31/22 109/74         Passed - Last Heart Rate in normal range    Pulse Readings from Last 1 Encounters:  10/31/22 70         Passed - Valid encounter within last 6 months    Recent Outpatient Visits           5 months ago Abscess of lung with pneumonia, unspecified laterality (HCC)   [place] [facility] - [facility] Dept Of [place]. [facility] Storm Frisk, MD   1 year ago Hyperthyroidism   [place] [facility] - [facility] Dept Of [place]. [facility] Storm Frisk, MD               rosuvastatin (CRESTOR) 20 MG tablet 30 tablet 0     Cardiovascular:  Antilipid - Statins 2 Failed - 04/04/2023  2:52 PM      Failed - Cr in normal range and within 360 days    Creatinine, Ser  Date Value Ref Range Status  10/31/2022 1.39 (H) 0.57 - 1.00 mg/dL Final         Failed - Lipid Panel in normal range within the last 12 months    Cholesterol, Total  Date Value Ref Range Status  10/31/2022 276 (H) 100 - 199 mg/dL Final   LDL Chol Calc (NIH)  Date Value Ref Range Status  10/31/2022 199 (H) 0 - 99 mg/dL Final   HDL  Date Value Ref Range Status  10/31/2022 52 >39 mg/dL Final   Triglycerides  Date Value Ref Range Status  10/31/2022 135 0 - 149 mg/dL Final         Passed - Patient is not pregnant      Passed - Valid encounter within last 12 months    Recent Outpatient Visits           5 months ago Abscess of lung with pneumonia, unspecified laterality (HCC)   [place] [facility] Dept Of [place]. [facility] Storm Frisk, MD   1 year ago Hyperthyroidism   [place] [facility] - A Dept Of [place]. [facility] Storm Frisk, MD               nicotine (NICODERM CQ - DOSED IN MG/24 HR) 7 mg/24hr patch 28 patch 0     Psychiatry:  Drug Dependence Therapy Passed - 04/04/2023  2:52 PM      Passed - Valid encounter within last 12 months    Recent Outpatient Visits           5 months ago Abscess of lung with pneumonia, unspecified laterality (HCC)   [place] [facility] Dept Of [place]. [facility] Storm Frisk, MD   1 year ago Hyperthyroidism   [place] [facility] - A Dept Of [place]. [facility] Storm Frisk, MD

## 2023-04-05 MED ORDER — ROSUVASTATIN CALCIUM 20 MG PO TABS: 20.0000 mg | ORAL_TABLET | Freq: Every day | ORAL | 0 refills | Status: DC

## 2023-04-05 MED ORDER — FUROSEMIDE 20 MG PO TABS: 20.0000 mg | ORAL_TABLET | Freq: Every day | ORAL | 0 refills | Status: DC

## 2023-04-05 MED ORDER — NICOTINE 7 MG/24HR TD PT24: 7.0000 mg | MEDICATED_PATCH | Freq: Every day | TRANSDERMAL | 0 refills | Status: DC

## 2023-04-05 MED ORDER — PROPRANOLOL HCL 10 MG PO TABS: 10.0000 mg | ORAL_TABLET | Freq: Three times a day (TID) | ORAL | 0 refills | Status: DC

## 2023-04-05 MED ORDER — EMPAGLIFLOZIN 10 MG PO TABS: 10.0000 mg | ORAL_TABLET | Freq: Every day | ORAL | 0 refills | Status: DC

## 2023-04-05 MED ORDER — PANTOPRAZOLE SODIUM 40 MG PO TBEC: 40.0000 mg | DELAYED_RELEASE_TABLET | Freq: Every day | ORAL | 0 refills | Status: DC

## 2023-04-12 ENCOUNTER — Other Ambulatory Visit: Payer: Self-pay | Admitting: Critical Care Medicine

## 2023-04-30 ENCOUNTER — Other Ambulatory Visit: Payer: Self-pay | Admitting: Critical Care Medicine

## 2023-04-30 NOTE — Telephone Encounter (Signed)
 Unable to contact patient by phone to schedule appointment and to give courtesy refill.

## 2023-05-04 ENCOUNTER — Other Ambulatory Visit (HOSPITAL_COMMUNITY): Payer: Self-pay

## 2023-05-07 ENCOUNTER — Other Ambulatory Visit (HOSPITAL_COMMUNITY): Payer: Self-pay

## 2023-05-08 ENCOUNTER — Other Ambulatory Visit: Payer: Self-pay | Admitting: Critical Care Medicine

## 2023-05-08 NOTE — Telephone Encounter (Signed)
 Last Fill: Furosemide: 04/05/23 30 tabs/0 RF       Jardiance:04/05/23 30 tabs/0 RF     Pantoprazole: 04/05/23 30 tabs/0 RF     Propanolol: 04/05/23 90 tabs/0 RF     Rosuvastatin: 04/05/23 30 tabs/0 RF     Spironolactone: 03/12/23 15 tabs/0 RF      Trazodone: 03/12/23 15 tabs/0 RF     Tretinoin: 03/12/23 20g/0 RF     Nicotine: 04/05/23 28 patch/0 RF  Last OV: 10/31/22 Next OV: None Scheduled  Routing to provider for review/authorization.

## 2023-05-08 NOTE — Telephone Encounter (Signed)
 Copied from CRM [id number]. Topic: Clinical - Medication Refill >> May 08, 2023  4:32 PM Gildardo Pounds wrote: Most Recent Primary Care Visit:  Provider: Shan Levans E  [facility]  Visit Type: OFFICE VISIT  Date: 10/31/2022  Medication: furosemide (LASIX) 20 MG tablet empagliflozin (JARDIANCE) 10 MG TABS tablet pantoprazole (PROTONIX) 40 MG tablet  propranolol (INDERAL) 10 MG tablet rosuvastatin (CRESTOR) 20 MG tablet spironolactone (ALDACTONE) 25 MG tablet traZODone (DESYREL) 50 MG tablet tretinoin (RETIN-A) 0.1 % cream nicotine (NICODERM CQ - DOSED IN MG/24 HR) 7 mg/24hr patch  Has the patient contacted their pharmacy? Yes (Agent: If no, request that the patient contact the pharmacy for the refill. If patient does not wish to contact the pharmacy document the reason why and proceed with request.) (Agent: If yes, when and what did the pharmacy advise?)Pharmacy called in refill  Is this the correct pharmacy for this prescription? Yes If no, delete pharmacy and type the correct one.  This is the patient's preferred pharmacy:  [facility], [place] - [address] [place] [address] Phone: [phone] Fax: [phone]    Has the prescription been filled recently? No  Is the patient out of the medication? Yes  Has the patient been seen for an appointment in the last year OR does the patient have an upcoming appointment? No  Can we respond through MyChart? No  Agent: Please be advised that Rx refills may take up to 3 business days. We ask that you follow-up with your pharmacy.

## 2023-05-15 ENCOUNTER — Other Ambulatory Visit (HOSPITAL_COMMUNITY): Payer: Self-pay

## 2023-05-16 ENCOUNTER — Telehealth: Payer: Self-pay | Admitting: Physician Assistant

## 2023-05-16 ENCOUNTER — Other Ambulatory Visit: Payer: Self-pay | Admitting: Physician Assistant

## 2023-05-16 ENCOUNTER — Other Ambulatory Visit: Payer: Self-pay

## 2023-05-16 MED ORDER — PANTOPRAZOLE SODIUM 40 MG PO TBEC
40.0000 mg | DELAYED_RELEASE_TABLET | Freq: Every day | ORAL | 0 refills | Status: DC
  Filled 2023-05-16 (×2): qty 30, 30d supply, fill #0

## 2023-05-16 MED ORDER — SPIRONOLACTONE 25 MG PO TABS
12.5000 mg | ORAL_TABLET | Freq: Every day | ORAL | 0 refills | Status: DC
  Filled 2023-05-16 (×2): qty 15, 30d supply, fill #0

## 2023-05-16 MED ORDER — TRAZODONE HCL 50 MG PO TABS
50.0000 mg | ORAL_TABLET | Freq: Every evening | ORAL | 0 refills | Status: DC | PRN
  Filled 2023-05-16 (×2): qty 20, 20d supply, fill #0

## 2023-05-16 NOTE — Telephone Encounter (Signed)
 Pt is requesting refills for pantoprazole, spironolactone, and traZODone to get by until next appt scheduled on 03/26. Please advise. Pt wants medication sent to [facility].

## 2023-05-22 ENCOUNTER — Other Ambulatory Visit: Payer: Self-pay | Admitting: Critical Care Medicine

## 2023-05-30 ENCOUNTER — Ambulatory Visit: Attending: Physician Assistant | Admitting: Physician Assistant

## 2023-05-30 ENCOUNTER — Other Ambulatory Visit: Payer: Self-pay

## 2023-05-30 ENCOUNTER — Encounter: Payer: Self-pay | Admitting: Physician Assistant

## 2023-05-30 VITALS — BP 100/66 | HR 57 | Temp 98.8°F | Resp 16 | Ht 62.0 in | Wt 130.0 lb

## 2023-05-30 DIAGNOSIS — E876 Hypokalemia: Secondary | ICD-10-CM

## 2023-05-30 DIAGNOSIS — G4709 Other insomnia: Secondary | ICD-10-CM

## 2023-05-30 DIAGNOSIS — E782 Mixed hyperlipidemia: Secondary | ICD-10-CM | POA: Diagnosis not present

## 2023-05-30 DIAGNOSIS — E0590 Thyrotoxicosis, unspecified without thyrotoxic crisis or storm: Secondary | ICD-10-CM

## 2023-05-30 DIAGNOSIS — I480 Paroxysmal atrial fibrillation: Secondary | ICD-10-CM

## 2023-05-30 DIAGNOSIS — I5022 Chronic systolic (congestive) heart failure: Secondary | ICD-10-CM

## 2023-05-30 DIAGNOSIS — J302 Other seasonal allergic rhinitis: Secondary | ICD-10-CM

## 2023-05-30 MED ORDER — PROPRANOLOL HCL 10 MG PO TABS
10.0000 mg | ORAL_TABLET | Freq: Three times a day (TID) | ORAL | 4 refills | Status: DC
  Filled 2023-05-30 (×2): qty 90, 30d supply, fill #0
  Filled 2023-10-15: qty 90, 30d supply, fill #1
  Filled 2023-10-15: qty 90, 30d supply, fill #0

## 2023-05-30 MED ORDER — METHIMAZOLE 10 MG PO TABS
20.0000 mg | ORAL_TABLET | Freq: Every day | ORAL | 3 refills | Status: DC
  Filled 2023-05-30 (×2): qty 60, 30d supply, fill #0

## 2023-05-30 MED ORDER — FLUTICASONE PROPIONATE 50 MCG/ACT NA SUSP
2.0000 | Freq: Every day | NASAL | 6 refills | Status: AC
  Filled 2023-05-30 (×2): qty 16, 30d supply, fill #0
  Filled 2023-08-01 – 2023-08-26 (×2): qty 16, 30d supply, fill #1
  Filled 2023-08-27: qty 16, 30d supply, fill #0
  Filled 2023-10-31: qty 16, 30d supply, fill #1

## 2023-05-30 MED ORDER — CETIRIZINE HCL 10 MG PO TABS
10.0000 mg | ORAL_TABLET | Freq: Every day | ORAL | 11 refills | Status: AC
  Filled 2023-05-30 (×2): qty 30, 30d supply, fill #0
  Filled 2023-07-05: qty 30, 30d supply, fill #1
  Filled 2023-07-05: qty 30, 30d supply, fill #0
  Filled 2023-08-26: qty 30, 30d supply, fill #1
  Filled 2023-09-19: qty 30, 30d supply, fill #2
  Filled 2023-10-31 – 2023-11-08 (×2): qty 30, 30d supply, fill #3
  Filled 2023-12-21 – 2023-12-27 (×2): qty 30, 30d supply, fill #4
  Filled 2024-01-19 – 2024-02-18 (×3): qty 30, 30d supply, fill #5
  Filled 2024-02-25: qty 30, 30d supply, fill #0
  Filled 2024-03-22: qty 30, 30d supply, fill #1
  Filled 2024-03-24 – 2024-04-10 (×2): qty 30, 30d supply, fill #0

## 2023-05-30 MED ORDER — SPIRONOLACTONE 25 MG PO TABS
12.5000 mg | ORAL_TABLET | Freq: Every day | ORAL | 2 refills | Status: DC
  Filled 2023-05-30 – 2023-06-21 (×3): qty 15, 30d supply, fill #0
  Filled 2023-08-18 – 2023-08-22 (×2): qty 15, 30d supply, fill #1
  Filled 2023-09-19: qty 15, 30d supply, fill #2

## 2023-05-30 MED ORDER — ROSUVASTATIN CALCIUM 20 MG PO TABS
20.0000 mg | ORAL_TABLET | Freq: Every day | ORAL | 5 refills | Status: DC
  Filled 2023-05-30 – 2023-07-05 (×2): qty 30, 30d supply, fill #0
  Filled 2023-07-05 – 2023-08-01 (×2): qty 30, 30d supply, fill #1
  Filled 2023-09-19: qty 30, 30d supply, fill #2
  Filled 2023-10-15: qty 30, 30d supply, fill #3
  Filled 2023-11-20: qty 30, 30d supply, fill #4

## 2023-05-30 MED ORDER — PANTOPRAZOLE SODIUM 40 MG PO TBEC
40.0000 mg | DELAYED_RELEASE_TABLET | Freq: Every day | ORAL | 4 refills | Status: DC
Start: 2023-05-30 — End: 2023-12-03
  Filled 2023-05-30 – 2023-06-21 (×3): qty 30, 30d supply, fill #0
  Filled 2023-08-01: qty 30, 30d supply, fill #1
  Filled 2023-09-02: qty 30, 30d supply, fill #2
  Filled 2023-10-15: qty 30, 30d supply, fill #3
  Filled 2023-11-20: qty 30, 30d supply, fill #4

## 2023-05-30 MED ORDER — EMPAGLIFLOZIN 10 MG PO TABS
10.0000 mg | ORAL_TABLET | Freq: Every day | ORAL | 4 refills | Status: DC
  Filled 2023-05-30 – 2023-07-16 (×4): qty 30, 30d supply, fill #0
  Filled 2023-08-01 – 2023-08-20 (×3): qty 30, 30d supply, fill #1
  Filled 2023-08-22: qty 30, 30d supply, fill #0
  Filled 2023-09-19: qty 30, 30d supply, fill #1
  Filled 2023-10-15: qty 30, 30d supply, fill #2
  Filled 2023-11-20: qty 30, 30d supply, fill #3

## 2023-05-30 MED ORDER — FUROSEMIDE 20 MG PO TABS
20.0000 mg | ORAL_TABLET | Freq: Every day | ORAL | 3 refills | Status: DC
Start: 2023-05-30 — End: 2023-10-15
  Filled 2023-05-30 – 2023-06-01 (×3): qty 30, 30d supply, fill #0
  Filled 2023-07-05: qty 30, 30d supply, fill #1
  Filled 2023-08-01: qty 30, 30d supply, fill #2
  Filled 2023-09-02: qty 30, 30d supply, fill #3

## 2023-05-30 MED ORDER — TRAZODONE HCL 50 MG PO TABS
50.0000 mg | ORAL_TABLET | Freq: Every evening | ORAL | 2 refills | Status: DC | PRN
  Filled 2023-05-30 – 2023-06-21 (×3): qty 30, 30d supply, fill #0
  Filled 2023-08-01: qty 30, 30d supply, fill #1
  Filled 2023-09-18: qty 30, 30d supply, fill #2

## 2023-05-30 NOTE — Patient Instructions (Signed)
Drink 64 ounces water daily
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
## 2023-05-30 NOTE — Progress Notes (Signed)
 Concerns with allergies and ear pain Left arm pain and neck tightness (has a book bag and another bag she carries)

## 2023-05-31 ENCOUNTER — Other Ambulatory Visit: Payer: Self-pay | Admitting: Physician Assistant

## 2023-05-31 ENCOUNTER — Other Ambulatory Visit: Payer: Self-pay

## 2023-05-31 DIAGNOSIS — E0590 Thyrotoxicosis, unspecified without thyrotoxic crisis or storm: Secondary | ICD-10-CM

## 2023-05-31 LAB — COMPREHENSIVE METABOLIC PANEL WITH GFR
ALT: 12 IU/L (ref 0–32)
AST: 25 IU/L (ref 0–40)
Albumin: 5 g/dL — ABNORMAL HIGH (ref 3.9–4.9)
Alkaline Phosphatase: 79 IU/L (ref 44–121)
BUN/Creatinine Ratio: 7 — ABNORMAL LOW (ref 9–23)
BUN: 18 mg/dL (ref 6–24)
Bilirubin Total: 1 mg/dL (ref 0.0–1.2)
CO2: 23 mmol/L (ref 20–29)
Calcium: 9.8 mg/dL (ref 8.7–10.2)
Chloride: 97 mmol/L (ref 96–106)
Creatinine, Ser: 2.5 mg/dL — ABNORMAL HIGH (ref 0.57–1.00)
Globulin, Total: 3 g/dL (ref 1.5–4.5)
Glucose: 62 mg/dL — ABNORMAL LOW (ref 70–99)
Potassium: 4 mmol/L (ref 3.5–5.2)
Sodium: 135 mmol/L (ref 134–144)
Total Protein: 8 g/dL (ref 6.0–8.5)
eGFR: 24 mL/min/{1.73_m2} — ABNORMAL LOW (ref >59)

## 2023-05-31 LAB — THYROID PANEL WITH TSH
T3 Uptake Ratio: 16 % — ABNORMAL LOW (ref 24–39)
T4, Total: <0.4 ug/dL — CL (ref 4.5–12.0)
TSH: 50 u[IU]/mL — ABNORMAL HIGH (ref 0.450–4.500)

## 2023-05-31 MED ORDER — METHIMAZOLE 10 MG PO TABS
10.0000 mg | ORAL_TABLET | Freq: Every day | ORAL | 3 refills | Status: DC
  Filled 2023-05-31 – 2023-07-05 (×3): qty 60, 60d supply, fill #0

## 2023-06-01 ENCOUNTER — Other Ambulatory Visit (HOSPITAL_COMMUNITY): Payer: Self-pay

## 2023-06-01 ENCOUNTER — Other Ambulatory Visit: Payer: Self-pay | Admitting: Physician Assistant

## 2023-06-01 ENCOUNTER — Other Ambulatory Visit: Payer: Self-pay

## 2023-06-01 DIAGNOSIS — R7989 Other specified abnormal findings of blood chemistry: Secondary | ICD-10-CM

## 2023-06-03 ENCOUNTER — Other Ambulatory Visit: Payer: Self-pay | Admitting: Critical Care Medicine

## 2023-06-03 DIAGNOSIS — I5022 Chronic systolic (congestive) heart failure: Secondary | ICD-10-CM

## 2023-06-05 ENCOUNTER — Other Ambulatory Visit (HOSPITAL_COMMUNITY): Payer: Self-pay

## 2023-06-05 ENCOUNTER — Other Ambulatory Visit: Payer: Self-pay

## 2023-06-06 ENCOUNTER — Other Ambulatory Visit: Payer: Self-pay

## 2023-06-07 ENCOUNTER — Other Ambulatory Visit (HOSPITAL_COMMUNITY): Payer: Self-pay

## 2023-06-07 ENCOUNTER — Other Ambulatory Visit: Payer: Self-pay

## 2023-06-21 ENCOUNTER — Other Ambulatory Visit: Payer: Self-pay

## 2023-06-21 ENCOUNTER — Other Ambulatory Visit (HOSPITAL_COMMUNITY): Payer: Self-pay

## 2023-06-25 ENCOUNTER — Other Ambulatory Visit (HOSPITAL_COMMUNITY): Payer: Self-pay

## 2023-06-25 ENCOUNTER — Other Ambulatory Visit: Payer: Self-pay

## 2023-06-25 MED ORDER — SODIUM FLUORIDE 1.1 % DT CREA
TOPICAL_CREAM | DENTAL | 2 refills | Status: AC
  Filled 2023-06-25 (×2): qty 51, 30d supply, fill #0
  Filled 2023-08-01: qty 51, 30d supply, fill #1

## 2023-06-25 MED ORDER — AMOXICILLIN 500 MG PO CAPS
500.0000 mg | ORAL_CAPSULE | Freq: Three times a day (TID) | ORAL | 0 refills | Status: DC
Start: 2023-06-25 — End: 2023-11-21
  Filled 2023-06-25: qty 21, 7d supply, fill #0

## 2023-06-26 ENCOUNTER — Other Ambulatory Visit: Payer: Self-pay

## 2023-07-05 ENCOUNTER — Other Ambulatory Visit: Payer: Self-pay | Admitting: Family Medicine

## 2023-07-05 ENCOUNTER — Other Ambulatory Visit: Payer: Self-pay

## 2023-07-05 ENCOUNTER — Other Ambulatory Visit (HOSPITAL_COMMUNITY): Payer: Self-pay

## 2023-07-13 ENCOUNTER — Ambulatory Visit: Attending: Nurse Practitioner

## 2023-07-13 DIAGNOSIS — R7989 Other specified abnormal findings of blood chemistry: Secondary | ICD-10-CM

## 2023-07-14 LAB — THYROID PANEL WITH TSH
Free Thyroxine Index: 0.8 — ABNORMAL LOW (ref 1.2–4.9)
T3 Uptake Ratio: 19 % — ABNORMAL LOW (ref 24–39)
T4, Total: 4.2 ug/dL — ABNORMAL LOW (ref 4.5–12.0)
TSH: 6.58 u[IU]/mL — ABNORMAL HIGH (ref 0.450–4.500)

## 2023-07-16 ENCOUNTER — Other Ambulatory Visit: Payer: Self-pay | Admitting: Physician Assistant

## 2023-07-16 ENCOUNTER — Other Ambulatory Visit (HOSPITAL_COMMUNITY): Payer: Self-pay

## 2023-07-16 ENCOUNTER — Other Ambulatory Visit: Payer: Self-pay

## 2023-07-16 DIAGNOSIS — E0590 Thyrotoxicosis, unspecified without thyrotoxic crisis or storm: Secondary | ICD-10-CM

## 2023-07-17 ENCOUNTER — Ambulatory Visit: Payer: Self-pay | Admitting: *Deleted

## 2023-08-01 ENCOUNTER — Other Ambulatory Visit: Payer: Self-pay

## 2023-08-01 ENCOUNTER — Other Ambulatory Visit (HOSPITAL_COMMUNITY): Payer: Self-pay

## 2023-08-13 ENCOUNTER — Other Ambulatory Visit: Payer: Self-pay

## 2023-08-18 ENCOUNTER — Other Ambulatory Visit (HOSPITAL_COMMUNITY): Payer: Self-pay

## 2023-08-18 ENCOUNTER — Other Ambulatory Visit: Payer: Self-pay

## 2023-08-20 ENCOUNTER — Other Ambulatory Visit (HOSPITAL_COMMUNITY): Payer: Self-pay

## 2023-08-20 ENCOUNTER — Other Ambulatory Visit: Payer: Self-pay

## 2023-08-22 ENCOUNTER — Other Ambulatory Visit (HOSPITAL_COMMUNITY): Payer: Self-pay

## 2023-08-22 ENCOUNTER — Other Ambulatory Visit: Payer: Self-pay

## 2023-08-23 ENCOUNTER — Other Ambulatory Visit (HOSPITAL_COMMUNITY): Payer: Self-pay

## 2023-08-27 ENCOUNTER — Other Ambulatory Visit: Payer: Self-pay

## 2023-08-27 ENCOUNTER — Other Ambulatory Visit (HOSPITAL_COMMUNITY): Payer: Self-pay

## 2023-08-30 ENCOUNTER — Encounter: Payer: Self-pay | Admitting: Family Medicine

## 2023-08-30 ENCOUNTER — Ambulatory Visit: Attending: Family Medicine | Admitting: Family Medicine

## 2023-08-30 VITALS — BP 107/70 | HR 64 | Ht 62.0 in | Wt 128.6 lb

## 2023-08-30 DIAGNOSIS — M542 Cervicalgia: Secondary | ICD-10-CM

## 2023-08-30 DIAGNOSIS — M545 Low back pain, unspecified: Secondary | ICD-10-CM | POA: Diagnosis not present

## 2023-08-30 DIAGNOSIS — I110 Hypertensive heart disease with heart failure: Secondary | ICD-10-CM | POA: Diagnosis not present

## 2023-08-30 DIAGNOSIS — E0590 Thyrotoxicosis, unspecified without thyrotoxic crisis or storm: Secondary | ICD-10-CM

## 2023-08-30 DIAGNOSIS — N179 Acute kidney failure, unspecified: Secondary | ICD-10-CM

## 2023-08-30 DIAGNOSIS — I480 Paroxysmal atrial fibrillation: Secondary | ICD-10-CM | POA: Diagnosis not present

## 2023-08-30 DIAGNOSIS — E782 Mixed hyperlipidemia: Secondary | ICD-10-CM

## 2023-08-30 NOTE — Patient Instructions (Signed)
 Please call Cardiology for an appointment  ; Placed in [facility] [address] Ph# [phone]
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
## 2023-08-31 LAB — CMP14+EGFR
ALT: 8 IU/L (ref 0–32)
AST: 19 IU/L (ref 0–40)
Albumin: 4.8 g/dL (ref 3.9–4.9)
Alkaline Phosphatase: 83 IU/L (ref 44–121)
BUN/Creatinine Ratio: 7 — ABNORMAL LOW (ref 9–23)
BUN: 13 mg/dL (ref 6–24)
Bilirubin Total: 0.6 mg/dL (ref 0.0–1.2)
CO2: 22 mmol/L (ref 20–29)
Calcium: 9.5 mg/dL (ref 8.7–10.2)
Chloride: 98 mmol/L (ref 96–106)
Creatinine, Ser: 1.75 mg/dL — ABNORMAL HIGH (ref 0.57–1.00)
Globulin, Total: 3.2 g/dL (ref 1.5–4.5)
Glucose: 84 mg/dL (ref 70–99)
Potassium: 3.8 mmol/L (ref 3.5–5.2)
Sodium: 141 mmol/L (ref 134–144)
Total Protein: 8 g/dL (ref 6.0–8.5)
eGFR: 37 mL/min/{1.73_m2} — ABNORMAL LOW (ref >59)

## 2023-08-31 LAB — T4, FREE: Free T4: 0.32 ng/dL — ABNORMAL LOW (ref 0.82–1.77)

## 2023-08-31 LAB — TSH: TSH: 11.6 u[IU]/mL — ABNORMAL HIGH (ref 0.450–4.500)

## 2023-08-31 LAB — T3: T3, Total: 128 ng/dL (ref 71–180)

## 2023-09-01 ENCOUNTER — Other Ambulatory Visit: Payer: Self-pay

## 2023-09-01 ENCOUNTER — Ambulatory Visit: Payer: Self-pay | Admitting: Family Medicine

## 2023-09-01 DIAGNOSIS — N1831 Chronic kidney disease, stage 3a: Secondary | ICD-10-CM

## 2023-09-01 DIAGNOSIS — E0590 Thyrotoxicosis, unspecified without thyrotoxic crisis or storm: Secondary | ICD-10-CM

## 2023-09-01 MED ORDER — METHIMAZOLE 5 MG PO TABS
5.0000 mg | ORAL_TABLET | Freq: Every day | ORAL | 2 refills | Status: DC
  Filled 2023-09-01 – 2023-09-03 (×2): qty 30, 30d supply, fill #0
  Filled 2023-10-15: qty 30, 30d supply, fill #1

## 2023-09-03 ENCOUNTER — Other Ambulatory Visit: Payer: Self-pay

## 2023-09-03 ENCOUNTER — Other Ambulatory Visit (HOSPITAL_COMMUNITY): Payer: Self-pay

## 2023-09-04 ENCOUNTER — Encounter: Admitting: Family Medicine

## 2023-09-18 ENCOUNTER — Other Ambulatory Visit (HOSPITAL_COMMUNITY): Payer: Self-pay

## 2023-09-19 ENCOUNTER — Other Ambulatory Visit (HOSPITAL_COMMUNITY): Payer: Self-pay

## 2023-09-20 ENCOUNTER — Other Ambulatory Visit: Payer: Self-pay

## 2023-09-20 ENCOUNTER — Other Ambulatory Visit (HOSPITAL_COMMUNITY): Payer: Self-pay

## 2023-10-15 ENCOUNTER — Other Ambulatory Visit: Payer: Self-pay

## 2023-10-15 ENCOUNTER — Other Ambulatory Visit: Payer: Self-pay | Admitting: Physician Assistant

## 2023-10-15 ENCOUNTER — Other Ambulatory Visit (HOSPITAL_COMMUNITY): Payer: Self-pay

## 2023-10-15 DIAGNOSIS — I5022 Chronic systolic (congestive) heart failure: Secondary | ICD-10-CM

## 2023-10-16 ENCOUNTER — Other Ambulatory Visit: Payer: Self-pay

## 2023-10-16 ENCOUNTER — Other Ambulatory Visit (HOSPITAL_COMMUNITY): Payer: Self-pay

## 2023-10-16 MED ORDER — SPIRONOLACTONE 25 MG PO TABS
12.5000 mg | ORAL_TABLET | Freq: Every day | ORAL | 2 refills | Status: DC
  Filled 2023-10-16: qty 15, 30d supply, fill #0
  Filled 2023-11-20: qty 15, 30d supply, fill #1
  Filled 2023-12-27: qty 15, 30d supply, fill #2

## 2023-10-16 MED ORDER — FUROSEMIDE 20 MG PO TABS
20.0000 mg | ORAL_TABLET | Freq: Every day | ORAL | 2 refills | Status: DC
  Filled 2023-10-16: qty 30, 30d supply, fill #0
  Filled 2023-11-20: qty 30, 30d supply, fill #1
  Filled 2023-12-27: qty 30, 30d supply, fill #2

## 2023-10-31 ENCOUNTER — Other Ambulatory Visit (HOSPITAL_COMMUNITY): Payer: Self-pay

## 2023-10-31 ENCOUNTER — Other Ambulatory Visit: Payer: Self-pay | Admitting: Physician Assistant

## 2023-10-31 DIAGNOSIS — G4709 Other insomnia: Secondary | ICD-10-CM

## 2023-10-31 MED ORDER — TRAZODONE HCL 50 MG PO TABS
50.0000 mg | ORAL_TABLET | Freq: Every evening | ORAL | 2 refills | Status: DC | PRN
  Filled 2023-10-31 – 2023-11-08 (×2): qty 30, 30d supply, fill #0
  Filled 2023-12-21 – 2023-12-27 (×2): qty 30, 30d supply, fill #1
  Filled 2024-02-08 – 2024-02-18 (×2): qty 30, 30d supply, fill #2
  Filled 2024-02-25: qty 30, 30d supply, fill #0

## 2023-11-01 ENCOUNTER — Other Ambulatory Visit (HOSPITAL_COMMUNITY): Payer: Self-pay

## 2023-11-01 ENCOUNTER — Other Ambulatory Visit: Payer: Self-pay

## 2023-11-07 ENCOUNTER — Other Ambulatory Visit: Payer: Self-pay

## 2023-11-08 ENCOUNTER — Other Ambulatory Visit (HOSPITAL_COMMUNITY): Payer: Self-pay

## 2023-11-09 ENCOUNTER — Other Ambulatory Visit: Payer: Self-pay

## 2023-11-21 ENCOUNTER — Other Ambulatory Visit: Payer: Self-pay

## 2023-11-21 ENCOUNTER — Other Ambulatory Visit (HOSPITAL_COMMUNITY): Payer: Self-pay

## 2023-11-21 ENCOUNTER — Ambulatory Visit: Attending: Cardiology | Admitting: Cardiovascular Disease

## 2023-11-21 ENCOUNTER — Encounter: Payer: Self-pay | Admitting: Cardiovascular Disease

## 2023-11-21 VITALS — BP 110/60 | HR 62 | Ht 62.0 in | Wt 133.2 lb

## 2023-11-21 DIAGNOSIS — Z720 Tobacco use: Secondary | ICD-10-CM | POA: Diagnosis not present

## 2023-11-21 DIAGNOSIS — I5022 Chronic systolic (congestive) heart failure: Secondary | ICD-10-CM

## 2023-11-21 DIAGNOSIS — I480 Paroxysmal atrial fibrillation: Secondary | ICD-10-CM

## 2023-11-21 NOTE — Patient Instructions (Signed)
 Medication Instructions:  No medication changes were made at this visit. Continue current regimen.   *If you need a refill on your cardiac medications before your next appointment, please call your pharmacy*  Lab Work: To be completed today: CBC and CMP  If you have labs (blood work) drawn today and your tests are completely normal, you will receive your results only by: MyChart Message (if you have MyChart) OR A paper copy in the mail If you have any lab test that is abnormal or we need to change your treatment, we will call you to review the results.  Testing/Procedures: Your physician has requested that you have an echocardiogram. Echocardiography is a painless test that uses sound waves to create images of your heart. It provides your doctor with information about the size and shape of your heart and how well your heart's chambers and valves are working. This procedure takes approximately one hour. There are no restrictions for this procedure. Please do NOT wear cologne, perfume, aftershave, or lotions (deodorant is allowed). Please arrive 15 minutes prior to your appointment time.  Please note: We ask at that you not bring children with you during ultrasound (echo/ vascular) testing. Due to room size and safety concerns, children are not allowed in the ultrasound rooms during exams. Our front office staff cannot provide observation of children in our lobby area while testing is being conducted. An adult accompanying a patient to their appointment will only be allowed in the ultrasound room at the discretion of the ultrasound technician under special circumstances. We apologize for any inconvenience.   Follow-Up: At [facility], you and your health needs are our priority.  As part of our continuing mission to provide you with exceptional heart care, our providers are all part of one team.  This team includes your primary Cardiologist (physician) and Advanced Practice Providers or  APPs (Physician Assistants and Nurse Practitioners) who all work together to provide you with the care you need, when you need it.  Your next appointment:   3 month(s)  Provider:   One of our Advanced Practice Providers (APPs): Morse Clause, PA-C  Lamarr Satterfield, NP Miriam Shams, NP  Olivia Pavy, PA-C Josefa Beauvais, NP  Leontine Salen, PA-C Orren Fabry, PA-C  [place], PA-C Ernest Dick, NP  Damien Braver, NP Jon Hails, PA-C  Waddell Donath, PA-C    Dayna Dunn, PA-C  Scott Weaver, PA-C Lum Louis, NP Katlyn West, NP Callie Goodrich, PA-C  Xika Zhao, NP Sheng Haley, PA-C    Kathleen Johnson, PA-C
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
## 2023-11-22 ENCOUNTER — Encounter: Payer: Self-pay | Admitting: Cardiovascular Disease

## 2023-11-22 ENCOUNTER — Telehealth (HOSPITAL_BASED_OUTPATIENT_CLINIC_OR_DEPARTMENT_OTHER): Payer: Self-pay | Admitting: Licensed Clinical Social Worker

## 2023-11-22 LAB — COMPREHENSIVE METABOLIC PANEL WITH GFR
ALT: 6 IU/L (ref 0–32)
AST: 13 IU/L (ref 0–40)
Albumin: 4.6 g/dL (ref 3.9–4.9)
Alkaline Phosphatase: 82 IU/L (ref 41–116)
BUN/Creatinine Ratio: 15 (ref 9–23)
BUN: 23 mg/dL (ref 6–24)
Bilirubin Total: 0.8 mg/dL (ref 0.0–1.2)
CO2: 24 mmol/L (ref 20–29)
Calcium: 9.6 mg/dL (ref 8.7–10.2)
Chloride: 100 mmol/L (ref 96–106)
Creatinine, Ser: 1.54 mg/dL — AB (ref 0.57–1.00)
Globulin, Total: 2.6 g/dL (ref 1.5–4.5)
Glucose: 81 mg/dL (ref 70–99)
Potassium: 4 mmol/L (ref 3.5–5.2)
Sodium: 137 mmol/L (ref 134–144)
Total Protein: 7.2 g/dL (ref 6.0–8.5)
eGFR: 43 mL/min/1.73 — AB (ref >59)

## 2023-11-22 LAB — CBC
Hematocrit: 40.1 % (ref 34.0–46.6)
Hemoglobin: 13.1 g/dL (ref 11.1–15.9)
MCH: 29.9 pg (ref 26.6–33.0)
MCHC: 32.7 g/dL (ref 31.5–35.7)
MCV: 92 fL (ref 79–97)
Platelets: 258 x10E3/uL (ref 150–450)
RBC: 4.38 x10E6/uL (ref 3.77–5.28)
RDW: 12.9 % (ref 11.7–15.4)
WBC: 8.7 x10E3/uL (ref 3.4–10.8)

## 2023-11-22 NOTE — Assessment & Plan Note (Signed)
 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  I am going to update an echocardiogram.  I cannot find any cardiac records since that time.  She is treated with Jardiance , spironolactone , and propranolol .  I am not sure why she is on propranolol  for beta-blockade, but since she is doing well and apparently asymptomatic I will not make any changes today since I am just meeting her.  We discussed the importance of medication adherence today.  We will try to evaluate whether there are any resources that she may need to help with adherence.  Will follow-up once her echo is completed.

## 2023-11-22 NOTE — Assessment & Plan Note (Signed)
 Patient has a history of atrial fibrillation with RVR in the setting of thyrotoxicosis.  She is now in sinus rhythm.  I do not think there is any indication for her to be on anticoagulation at this point.  No symptoms of atrial fibrillation noted.

## 2023-11-22 NOTE — Assessment & Plan Note (Signed)
Cessation counseling done 

## 2023-11-22 NOTE — Telephone Encounter (Signed)
 H&V Care Navigation CSW Progress Note  Clinical Social Worker contacted patient by phone to f/u on referral for cost concerns related to medications and SDOH screening. No answer this morning at [phone], left voicemail. Will re-attempt again as able.  Patient is participating in a Managed Medicaid Plan:  Yes- [employer], also has [facility] which may be causing the cost issues with medications  SDOH Screenings   Food Insecurity: Food Insecurity Present (05/30/2023)  Housing: Unknown (05/30/2023)  Transportation Needs: No Transportation Needs (05/30/2023)  Utilities: Not At Risk (05/30/2023)  Alcohol Screen: Low Risk  (05/30/2023)  Depression (PHQ2-9): Low Risk  (08/30/2023)  Financial Resource Strain: Medium Risk (05/30/2023)  Physical Activity: Unknown (05/30/2023)  Social Connections: Moderately Integrated (05/30/2023)  Stress: No Stress Concern Present (05/30/2023)  Tobacco Use: High Risk (11/21/2023)  Health Literacy: Adequate Health Literacy (05/30/2023)     Marit Lark, MSW, LCSW Clinical Social Worker II [facility] Heart/Vascular Care Navigation  [phone]- work cell phone (preferred)

## 2023-11-23 ENCOUNTER — Telehealth: Payer: Self-pay | Admitting: Licensed Clinical Social Worker

## 2023-11-23 ENCOUNTER — Ambulatory Visit: Payer: Self-pay | Admitting: Cardiovascular Disease

## 2023-11-23 NOTE — Telephone Encounter (Signed)
 H&V Care Navigation CSW Progress Note  Clinical Social Worker contacted patient by phone to f/u on referral for cost concerns related to medications and SDOH screening. No answer this again morning at [phone], left 2nd voicemail. Will re-attempt again as able.   Patient is participating in a Managed Medicaid Plan:  Yes- [employer], also has [facility] which may be causing the cost issues with medications  SDOH Screenings   Food Insecurity: Food Insecurity Present (05/30/2023)  Housing: Unknown (05/30/2023)  Transportation Needs: No Transportation Needs (05/30/2023)  Utilities: Not At Risk (05/30/2023)  Alcohol Screen: Low Risk  (05/30/2023)  Depression (PHQ2-9): Low Risk  (08/30/2023)  Financial Resource Strain: Medium Risk (05/30/2023)  Physical Activity: Unknown (05/30/2023)  Social Connections: Moderately Integrated (05/30/2023)  Stress: No Stress Concern Present (05/30/2023)  Tobacco Use: High Risk (11/22/2023)  Health Literacy: Adequate Health Literacy (05/30/2023)    Marit Lark, MSW, LCSW Clinical Social Worker II [facility] Heart/Vascular Care Navigation  [phone]- work cell phone (preferred)

## 2023-11-27 ENCOUNTER — Telehealth: Payer: Self-pay | Admitting: Licensed Clinical Social Worker

## 2023-11-27 NOTE — Telephone Encounter (Signed)
 H&V Care Navigation CSW Progress Note  Clinical Social Worker contacted patient by phone to f/u on referral for cost concerns related to medications and SDOH screening. No answer this again morning at [phone], left 3rd voicemail. Remain available as needed for any resources should pt reach out to our team.    Patient is participating in a Managed Medicaid Plan:  Yes- [employer], also has [facility] which may be causing the cost issues with medications  SDOH Screenings   Food Insecurity: Food Insecurity Present (05/30/2023)  Housing: Unknown (05/30/2023)  Transportation Needs: No Transportation Needs (05/30/2023)  Utilities: Not At Risk (05/30/2023)  Alcohol Screen: Low Risk  (05/30/2023)  Depression (PHQ2-9): Low Risk  (08/30/2023)  Financial Resource Strain: Medium Risk (05/30/2023)  Physical Activity: Unknown (05/30/2023)  Social Connections: Moderately Integrated (05/30/2023)  Stress: No Stress Concern Present (05/30/2023)  Tobacco Use: High Risk (11/22/2023)  Health Literacy: Adequate Health Literacy (05/30/2023)     Marit Lark, MSW, LCSW Clinical Social Worker II [facility] Heart/Vascular Care Navigation  [phone]- work cell phone (preferred)

## 2023-12-03 ENCOUNTER — Other Ambulatory Visit (HOSPITAL_COMMUNITY): Payer: Self-pay

## 2023-12-03 ENCOUNTER — Telehealth: Payer: Self-pay | Admitting: Family Medicine

## 2023-12-03 ENCOUNTER — Ambulatory Visit: Attending: Family Medicine | Admitting: Family Medicine

## 2023-12-03 ENCOUNTER — Encounter: Payer: Self-pay | Admitting: Family Medicine

## 2023-12-03 ENCOUNTER — Other Ambulatory Visit: Payer: Self-pay

## 2023-12-03 VITALS — BP 94/65 | HR 68 | Ht 62.0 in | Wt 130.8 lb

## 2023-12-03 DIAGNOSIS — Z79899 Other long term (current) drug therapy: Secondary | ICD-10-CM

## 2023-12-03 DIAGNOSIS — I480 Paroxysmal atrial fibrillation: Secondary | ICD-10-CM

## 2023-12-03 DIAGNOSIS — I5022 Chronic systolic (congestive) heart failure: Secondary | ICD-10-CM

## 2023-12-03 DIAGNOSIS — M542 Cervicalgia: Secondary | ICD-10-CM

## 2023-12-03 DIAGNOSIS — E782 Mixed hyperlipidemia: Secondary | ICD-10-CM

## 2023-12-03 DIAGNOSIS — E0590 Thyrotoxicosis, unspecified without thyrotoxic crisis or storm: Secondary | ICD-10-CM

## 2023-12-03 DIAGNOSIS — M25519 Pain in unspecified shoulder: Secondary | ICD-10-CM

## 2023-12-03 DIAGNOSIS — Z23 Encounter for immunization: Secondary | ICD-10-CM

## 2023-12-03 DIAGNOSIS — G8929 Other chronic pain: Secondary | ICD-10-CM

## 2023-12-03 DIAGNOSIS — Z7984 Long term (current) use of oral hypoglycemic drugs: Secondary | ICD-10-CM

## 2023-12-03 DIAGNOSIS — F17210 Nicotine dependence, cigarettes, uncomplicated: Secondary | ICD-10-CM

## 2023-12-03 MED ORDER — TRETINOIN 0.1 % EX CREA
TOPICAL_CREAM | Freq: Every day | CUTANEOUS | 1 refills | Status: AC
  Filled 2023-12-03: qty 20, 30d supply, fill #0
  Filled 2023-12-03: qty 20, fill #0

## 2023-12-03 MED ORDER — DICLOFENAC SODIUM 1 % EX GEL
4.0000 g | Freq: Four times a day (QID) | CUTANEOUS | 1 refills | Status: AC
  Filled 2023-12-03: qty 100, 30d supply, fill #0
  Filled 2023-12-03: qty 100, fill #0

## 2023-12-03 MED ORDER — PANTOPRAZOLE SODIUM 40 MG PO TBEC
40.0000 mg | DELAYED_RELEASE_TABLET | Freq: Every day | ORAL | 1 refills | Status: AC
  Filled 2023-12-03 – 2023-12-27 (×2): qty 90, 90d supply, fill #0
  Filled 2024-01-19 – 2024-04-03 (×2): qty 90, 90d supply, fill #1
  Filled 2024-04-10: qty 90, 90d supply, fill #0

## 2023-12-03 MED ORDER — EMPAGLIFLOZIN 10 MG PO TABS
10.0000 mg | ORAL_TABLET | Freq: Every day | ORAL | 1 refills | Status: DC
  Filled 2023-12-03 – 2024-01-10 (×2): qty 90, 90d supply, fill #0

## 2023-12-03 MED ORDER — NICOTINE 7 MG/24HR TD PT24
7.0000 mg | MEDICATED_PATCH | Freq: Every day | TRANSDERMAL | 3 refills | Status: AC
  Filled 2023-12-03 (×2): qty 28, 28d supply, fill #0

## 2023-12-03 MED ORDER — PROPRANOLOL HCL 10 MG PO TABS
10.0000 mg | ORAL_TABLET | Freq: Two times a day (BID) | ORAL | 1 refills | Status: DC
  Filled 2023-12-03 – 2023-12-27 (×3): qty 60, 30d supply, fill #0
  Filled 2024-01-19 – 2024-02-18 (×3): qty 60, 30d supply, fill #1

## 2023-12-03 MED ORDER — ROSUVASTATIN CALCIUM 20 MG PO TABS
20.0000 mg | ORAL_TABLET | Freq: Every day | ORAL | 1 refills | Status: DC
  Filled 2023-12-03 – 2024-01-10 (×2): qty 90, 90d supply, fill #0

## 2023-12-03 NOTE — Telephone Encounter (Signed)
 Can you please schedule her for 1 month complete physical exam and Pap smear?  Thank you.

## 2023-12-03 NOTE — Patient Instructions (Signed)
 VISIT SUMMARY:  During today's visit, we addressed your medication refills and management of your neck and shoulder pain. We also reviewed your current medications and discussed your ongoing health conditions, including atrial fibrillation, heart failure, hyperthyroidism, hyperlipidemia, gastroesophageal reflux disease, tobacco use disorder, acne, and insomnia. Additionally, we discussed general health maintenance and scheduled necessary screenings and vaccinations.  YOUR PLAN:  -PAROXYSMAL ATRIAL FIBRILLATION AND HEART FAILURE: Paroxysmal atrial fibrillation is a type of irregular heartbeat that comes and goes, and heart failure is a condition where the heart does not pump blood as well as it should. We will continue managing these conditions with propranolol  10 mg twice daily and Jardiance . Eliquis  has been discontinued due to thyroid -related atrial fibrillation. The propranolol  dose has been adjusted to address hypotension and visual disturbances.  -HYPERTHYROIDISM: Hyperthyroidism is a condition where the thyroid  gland produces too much thyroid  hormone. We will continue managing this with methimazole . You need to have thyroid  function tests done so we can adjust your methimazole  dose based on the results.  -HYPERLIPIDEMIA: Hyperlipidemia is having high levels of fats (lipids) in the blood, which can increase the risk of heart disease. You are due for a cholesterol check, so we have scheduled a fasting blood test for cholesterol on December 19, 2023.  -NECK AND SHOULDER PAIN: Your neck and shoulder pain is likely due to intermittent stiffness, possibly related to weather changes. You can continue to manage this with ibuprofen  as needed.  -GASTROESOPHAGEAL REFLUX DISEASE: Gastroesophageal reflux disease (GERD) is a condition where stomach acid frequently flows back into the tube connecting your mouth and stomach. We will continue managing this with pantoprazole , and I have refilled your  prescription.  -TOBACCO USE DISORDER: Tobacco use disorder is a dependence on tobacco products. You are currently smoking less than one row of cigarettes per week and using nicotine  patches. I have refilled your nicotine  patches and we discussed smoking cessation support.  -ACNE: Acne is a skin condition that occurs when hair follicles become plugged with oil and dead skin cells. We will continue managing this with tretinoin  cream, and I have refilled your prescription.  -INSOMNIA: Insomnia is a sleep disorder where you have trouble falling and/or staying asleep. We will continue managing this with trazodone  as needed.  -GENERAL HEALTH MAINTENANCE: You are due for routine screenings and vaccinations. We administered a flu shot today and scheduled a Pap smear and a complete physical exam in one month.  INSTRUCTIONS:  Please follow up with the following instructions:  1. Continue taking your medications as prescribed.  2. Get your thyroid  function tests done as soon as possible.  3. Attend your fasting blood test for cholesterol on December 19, 2023.  4. Use ibuprofen  as needed for neck and shoulder pain.  5. Refill your pantoprazole , nicotine  patches, and tretinoin  cream prescriptions.  6. Consider smoking cessation support.  7. Attend your scheduled Pap smear and complete physical exam in one month.
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
## 2023-12-04 ENCOUNTER — Other Ambulatory Visit: Payer: Self-pay

## 2023-12-04 ENCOUNTER — Other Ambulatory Visit (HOSPITAL_COMMUNITY): Payer: Self-pay

## 2023-12-04 ENCOUNTER — Ambulatory Visit: Payer: Self-pay | Admitting: Family Medicine

## 2023-12-04 DIAGNOSIS — E0590 Thyrotoxicosis, unspecified without thyrotoxic crisis or storm: Secondary | ICD-10-CM

## 2023-12-04 LAB — CMP14+EGFR
ALT: 11 IU/L (ref 0–32)
AST: 16 IU/L (ref 0–40)
Albumin: 4.7 g/dL (ref 3.9–4.9)
Alkaline Phosphatase: 90 IU/L (ref 41–116)
BUN/Creatinine Ratio: 15 (ref 9–23)
BUN: 24 mg/dL (ref 6–24)
Bilirubin Total: 0.4 mg/dL (ref 0.0–1.2)
CO2: 26 mmol/L (ref 20–29)
Calcium: 10 mg/dL (ref 8.7–10.2)
Chloride: 99 mmol/L (ref 96–106)
Creatinine, Ser: 1.58 mg/dL — ABNORMAL HIGH (ref 0.57–1.00)
Globulin, Total: 3 g/dL (ref 1.5–4.5)
Glucose: 86 mg/dL (ref 70–99)
Potassium: 3.8 mmol/L (ref 3.5–5.2)
Sodium: 140 mmol/L (ref 134–144)
Total Protein: 7.7 g/dL (ref 6.0–8.5)
eGFR: 41 mL/min/1.73 — ABNORMAL LOW (ref >59)

## 2023-12-04 LAB — TSH: TSH: 5.71 u[IU]/mL — ABNORMAL HIGH (ref 0.450–4.500)

## 2023-12-04 LAB — T4, FREE: Free T4: 0.28 ng/dL — ABNORMAL LOW (ref 0.82–1.77)

## 2023-12-04 LAB — T3: T3, Total: 107 ng/dL (ref 71–180)

## 2023-12-04 MED ORDER — METHIMAZOLE 5 MG PO TABS
5.0000 mg | ORAL_TABLET | Freq: Two times a day (BID) | ORAL | 2 refills | Status: DC
  Filled 2023-12-04: qty 60, 30d supply, fill #0

## 2023-12-04 NOTE — Telephone Encounter (Signed)
 Contacted pt left vm to sch appt

## 2023-12-05 ENCOUNTER — Other Ambulatory Visit: Payer: Self-pay

## 2023-12-07 NOTE — Telephone Encounter (Signed)
 FYI   The patient came into the office for lab results. Cassie, the triage nurse reviewed the lab results with the patient.

## 2023-12-07 NOTE — Progress Notes (Signed)
 Patient in the office today given labs results per pcp'Thyroid  level is abnormal and shows that she is more hypothyroid than hyperthyroid. I would like her to hold off on her methimazole  for 6 weeks and I we will repeat her thyroid  panel in 1 month. Can you please ensure she is scheduled for a complete physical exam and Pap smear for 1 month as the front office staff was unable to reach her to schedule this appointment.

## 2023-12-10 ENCOUNTER — Other Ambulatory Visit: Payer: Self-pay

## 2023-12-20 ENCOUNTER — Other Ambulatory Visit (HOSPITAL_COMMUNITY): Payer: Self-pay

## 2023-12-21 ENCOUNTER — Other Ambulatory Visit (HOSPITAL_COMMUNITY): Payer: Self-pay

## 2023-12-24 ENCOUNTER — Other Ambulatory Visit (HOSPITAL_COMMUNITY): Payer: Self-pay

## 2023-12-24 ENCOUNTER — Other Ambulatory Visit: Payer: Self-pay

## 2023-12-27 ENCOUNTER — Ambulatory Visit (HOSPITAL_COMMUNITY)
Admission: RE | Admit: 2023-12-27 | Discharge: 2023-12-27 | Disposition: A | Discharge status: Home / Self Care | Source: Ambulatory Visit | Attending: Internal Medicine | Admitting: Internal Medicine

## 2023-12-27 ENCOUNTER — Other Ambulatory Visit: Payer: Self-pay

## 2023-12-27 DIAGNOSIS — I480 Paroxysmal atrial fibrillation: Secondary | ICD-10-CM | POA: Diagnosis not present

## 2023-12-27 DIAGNOSIS — I5022 Chronic systolic (congestive) heart failure: Secondary | ICD-10-CM | POA: Diagnosis present

## 2023-12-27 DIAGNOSIS — Z720 Tobacco use: Secondary | ICD-10-CM | POA: Insufficient documentation

## 2023-12-27 LAB — ECHOCARDIOGRAM COMPLETE
Area-P 1/2: 3.03 cm2
S' Lateral: 3.7 cm

## 2023-12-28 ENCOUNTER — Other Ambulatory Visit: Payer: Self-pay

## 2023-12-28 ENCOUNTER — Other Ambulatory Visit (HOSPITAL_COMMUNITY): Payer: Self-pay

## 2024-01-11 ENCOUNTER — Other Ambulatory Visit (HOSPITAL_COMMUNITY): Payer: Self-pay

## 2024-01-19 ENCOUNTER — Other Ambulatory Visit (HOSPITAL_COMMUNITY): Payer: Self-pay

## 2024-01-19 ENCOUNTER — Other Ambulatory Visit: Payer: Self-pay | Admitting: Family Medicine

## 2024-01-19 DIAGNOSIS — I5022 Chronic systolic (congestive) heart failure: Secondary | ICD-10-CM

## 2024-01-20 ENCOUNTER — Other Ambulatory Visit (HOSPITAL_COMMUNITY): Payer: Self-pay

## 2024-01-20 MED ORDER — SPIRONOLACTONE 25 MG PO TABS
12.5000 mg | ORAL_TABLET | Freq: Every day | ORAL | 2 refills | Status: DC
  Filled 2024-01-20 – 2024-01-21 (×2): qty 15, 30d supply, fill #0

## 2024-01-20 MED ORDER — FUROSEMIDE 20 MG PO TABS
20.0000 mg | ORAL_TABLET | Freq: Every day | ORAL | 2 refills | Status: DC
  Filled 2024-01-20 – 2024-01-21 (×2): qty 30, 30d supply, fill #0

## 2024-01-21 ENCOUNTER — Other Ambulatory Visit (HOSPITAL_BASED_OUTPATIENT_CLINIC_OR_DEPARTMENT_OTHER): Payer: Self-pay

## 2024-01-21 ENCOUNTER — Other Ambulatory Visit: Payer: Self-pay

## 2024-01-21 ENCOUNTER — Other Ambulatory Visit (HOSPITAL_COMMUNITY): Payer: Self-pay

## 2024-01-22 ENCOUNTER — Other Ambulatory Visit (HOSPITAL_COMMUNITY): Payer: Self-pay

## 2024-01-22 ENCOUNTER — Encounter: Payer: Self-pay | Admitting: Family Medicine

## 2024-01-22 ENCOUNTER — Other Ambulatory Visit: Payer: Self-pay

## 2024-01-22 ENCOUNTER — Other Ambulatory Visit (HOSPITAL_COMMUNITY)
Admission: RE | Admit: 2024-01-22 | Discharge: 2024-01-22 | Disposition: A | Discharge status: Home / Self Care | Source: Ambulatory Visit | Attending: Family Medicine | Admitting: Family Medicine

## 2024-01-22 ENCOUNTER — Ambulatory Visit: Attending: Family Medicine | Admitting: Family Medicine

## 2024-01-22 VITALS — BP 107/68 | HR 64 | Temp 98.2°F | Ht 62.0 in | Wt 136.8 lb

## 2024-01-22 DIAGNOSIS — Z124 Encounter for screening for malignant neoplasm of cervix: Secondary | ICD-10-CM | POA: Insufficient documentation

## 2024-01-22 DIAGNOSIS — Z0000 Encounter for general adult medical examination without abnormal findings: Secondary | ICD-10-CM | POA: Diagnosis not present

## 2024-01-22 DIAGNOSIS — Z720 Tobacco use: Secondary | ICD-10-CM

## 2024-01-22 DIAGNOSIS — Z113 Encounter for screening for infections with a predominantly sexual mode of transmission: Secondary | ICD-10-CM | POA: Diagnosis not present

## 2024-01-22 DIAGNOSIS — Z1231 Encounter for screening mammogram for malignant neoplasm of breast: Secondary | ICD-10-CM

## 2024-01-22 MED ORDER — NICOTINE POLACRILEX 4 MG MT GUM
4.0000 mg | CHEWING_GUM | OROMUCOSAL | 0 refills | Status: AC | PRN
  Filled 2024-01-22 (×2): qty 100, 30d supply, fill #0

## 2024-01-22 NOTE — Patient Instructions (Signed)
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
## 2024-01-23 LAB — CERVICOVAGINAL ANCILLARY ONLY
Bacterial Vaginitis (gardnerella): NEGATIVE
Candida Glabrata: NEGATIVE
Candida Vaginitis: POSITIVE — AB
Chlamydia: NEGATIVE
Comment: NEGATIVE
Comment: NEGATIVE
Comment: NEGATIVE
Comment: NEGATIVE
Comment: NEGATIVE
Comment: NORMAL
Neisseria Gonorrhea: NEGATIVE
Trichomonas: NEGATIVE

## 2024-01-23 LAB — CYTOLOGY - PAP
Adequacy: ABSENT
Comment: NEGATIVE
Diagnosis: NEGATIVE
High risk HPV: NEGATIVE

## 2024-01-24 ENCOUNTER — Other Ambulatory Visit: Payer: Self-pay

## 2024-01-24 ENCOUNTER — Ambulatory Visit: Payer: Self-pay | Admitting: Family Medicine

## 2024-01-24 ENCOUNTER — Other Ambulatory Visit (HOSPITAL_COMMUNITY): Payer: Self-pay

## 2024-01-24 MED ORDER — FLUCONAZOLE 150 MG PO TABS
150.0000 mg | ORAL_TABLET | Freq: Once | ORAL | 0 refills | Status: AC
  Filled 2024-01-24 (×2): qty 1, 1d supply, fill #0

## 2024-01-25 ENCOUNTER — Other Ambulatory Visit: Payer: Self-pay

## 2024-02-08 ENCOUNTER — Other Ambulatory Visit: Payer: Self-pay

## 2024-02-08 ENCOUNTER — Other Ambulatory Visit (HOSPITAL_COMMUNITY): Payer: Self-pay

## 2024-02-13 ENCOUNTER — Other Ambulatory Visit: Payer: Self-pay

## 2024-02-18 ENCOUNTER — Other Ambulatory Visit (HOSPITAL_COMMUNITY): Payer: Self-pay

## 2024-02-19 ENCOUNTER — Other Ambulatory Visit: Payer: Self-pay

## 2024-02-19 ENCOUNTER — Other Ambulatory Visit (HOSPITAL_COMMUNITY): Payer: Self-pay

## 2024-02-20 ENCOUNTER — Ambulatory Visit: Admitting: Physician Assistant

## 2024-02-20 ENCOUNTER — Encounter: Payer: Self-pay | Admitting: Physician Assistant

## 2024-02-20 ENCOUNTER — Other Ambulatory Visit (HOSPITAL_COMMUNITY): Payer: Self-pay

## 2024-02-20 VITALS — BP 120/84 | HR 83 | Ht 62.0 in | Wt 136.0 lb

## 2024-02-20 DIAGNOSIS — I5020 Unspecified systolic (congestive) heart failure: Secondary | ICD-10-CM

## 2024-02-20 DIAGNOSIS — E782 Mixed hyperlipidemia: Secondary | ICD-10-CM | POA: Diagnosis not present

## 2024-02-20 DIAGNOSIS — I480 Paroxysmal atrial fibrillation: Secondary | ICD-10-CM

## 2024-02-20 MED ORDER — EMPAGLIFLOZIN 10 MG PO TABS
10.0000 mg | ORAL_TABLET | Freq: Every day | ORAL | 3 refills | Status: AC
  Filled 2024-02-20 – 2024-04-10 (×2): qty 90, 90d supply, fill #0

## 2024-02-20 MED ORDER — FUROSEMIDE 20 MG PO TABS
20.0000 mg | ORAL_TABLET | Freq: Every day | ORAL | 3 refills | Status: AC
  Filled 2024-02-20: qty 90, 90d supply, fill #0

## 2024-02-20 MED ORDER — PROPRANOLOL HCL 10 MG PO TABS
10.0000 mg | ORAL_TABLET | Freq: Two times a day (BID) | ORAL | 3 refills | Status: AC
  Filled 2024-02-20 – 2024-02-25 (×2): qty 180, 90d supply, fill #0

## 2024-02-20 MED ORDER — ROSUVASTATIN CALCIUM 20 MG PO TABS
20.0000 mg | ORAL_TABLET | Freq: Every day | ORAL | 3 refills | Status: AC
  Filled 2024-02-20: qty 90, 90d supply, fill #0

## 2024-02-20 MED ORDER — SPIRONOLACTONE 25 MG PO TABS
25.0000 mg | ORAL_TABLET | ORAL | 3 refills | Status: AC
  Filled 2024-02-20: qty 45, 90d supply, fill #0

## 2024-02-20 NOTE — Patient Instructions (Signed)
 Medication Instructions:  Your physician recommends that you continue on your current medications as directed. Please refer to the Current Medication list given to you today. *If you need a refill on your cardiac medications before your next appointment, please call your pharmacy*  Lab Work: None Ordered If you have labs (blood work) drawn today and your tests are completely normal, you will receive your results only by: MyChart Message (if you have MyChart) OR A paper copy in the mail If you have any lab test that is abnormal or we need to change your treatment, we will call you to review the results.  Testing/Procedures: None ordered  Follow-Up: At [facility], you and your health needs are our priority.  As part of our continuing mission to provide you with exceptional heart care, our providers are all part of one team.  This team includes your primary Cardiologist (physician) and Advanced Practice Providers or APPs (Physician Assistants and Nurse Practitioners) who all work together to provide you with the care you need, when you need it.  Your next appointment:   6 month(s)  Provider:   Ozell Fell, MD or Glendia Ferrier, PA   We recommend signing up for the patient portal called MyChart.  Sign up information is provided on this After Visit Summary.  MyChart is used to connect with patients for Virtual Visits (Telemedicine).  Patients are able to view lab/test results, encounter notes, upcoming appointments, etc.  Non-urgent messages can be sent to your provider as well.   To learn more about what you can do with MyChart, go to [email or web address].   Other Instructions
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
## 2024-02-20 NOTE — Assessment & Plan Note (Signed)
 LDL elevated in 10/2022. She was not on statin Rx at that time. She has labs planned with primary care in the near future. - Continue Rosuvastatin  20 mg once daily.
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
## 2024-02-21 ENCOUNTER — Other Ambulatory Visit: Payer: Self-pay

## 2024-02-25 ENCOUNTER — Other Ambulatory Visit: Payer: Self-pay

## 2024-02-26 ENCOUNTER — Other Ambulatory Visit: Payer: Self-pay

## 2024-03-22 ENCOUNTER — Other Ambulatory Visit: Payer: Self-pay | Admitting: Family Medicine

## 2024-03-22 DIAGNOSIS — G4709 Other insomnia: Secondary | ICD-10-CM

## 2024-03-23 MED ORDER — TRAZODONE HCL 50 MG PO TABS
50.0000 mg | ORAL_TABLET | Freq: Every evening | ORAL | 0 refills | Status: AC | PRN
  Filled 2024-03-23 – 2024-04-10 (×3): qty 30, 30d supply, fill #0

## 2024-03-24 ENCOUNTER — Other Ambulatory Visit (HOSPITAL_COMMUNITY): Payer: Self-pay

## 2024-03-24 ENCOUNTER — Other Ambulatory Visit: Payer: Self-pay

## 2024-03-25 ENCOUNTER — Other Ambulatory Visit: Payer: Self-pay

## 2024-03-25 ENCOUNTER — Other Ambulatory Visit (HOSPITAL_COMMUNITY): Payer: Self-pay

## 2024-03-28 ENCOUNTER — Other Ambulatory Visit: Payer: Self-pay

## 2024-04-03 ENCOUNTER — Other Ambulatory Visit (HOSPITAL_COMMUNITY): Payer: Self-pay

## 2024-04-10 ENCOUNTER — Other Ambulatory Visit (HOSPITAL_COMMUNITY): Payer: Self-pay

## 2024-04-24 ENCOUNTER — Ambulatory Visit: Admitting: Family Medicine
